# Patient Record
Sex: Female | Born: 1940 | Race: White | Hispanic: No | Marital: Married
Health system: Southern US, Community
[De-identification: ages and names within clinical notes are randomized; demographics above are authoritative.]

## PROBLEM LIST (undated history)

## (undated) DIAGNOSIS — M549 Dorsalgia, unspecified: Secondary | ICD-10-CM

## (undated) DIAGNOSIS — G8929 Other chronic pain: Secondary | ICD-10-CM

## (undated) DIAGNOSIS — F319 Bipolar disorder, unspecified: Secondary | ICD-10-CM

## (undated) DIAGNOSIS — I10 Essential (primary) hypertension: Secondary | ICD-10-CM

## (undated) DIAGNOSIS — E785 Hyperlipidemia, unspecified: Secondary | ICD-10-CM

## (undated) DIAGNOSIS — E119 Type 2 diabetes mellitus without complications: Secondary | ICD-10-CM

## (undated) DIAGNOSIS — F419 Anxiety disorder, unspecified: Secondary | ICD-10-CM

## (undated) DIAGNOSIS — F39 Unspecified mood [affective] disorder: Secondary | ICD-10-CM

## (undated) HISTORY — PX: BACK SURGERY: SHX140

## (undated) HISTORY — PX: TUBAL LIGATION: SHX77

## (undated) HISTORY — PX: TONSILLECTOMY: SUR1361

## (undated) HISTORY — DX: Dorsalgia, unspecified: M54.9

## (undated) HISTORY — DX: Other chronic pain: G89.29

## (undated) HISTORY — DX: Type 2 diabetes mellitus without complications: E11.9

## (undated) HISTORY — DX: Anxiety disorder, unspecified: F41.9

## (undated) HISTORY — DX: Unspecified mood (affective) disorder: F39

## (undated) HISTORY — DX: Essential (primary) hypertension: I10

## (undated) HISTORY — PX: FOOT SURGERY: SHX648

## (undated) HISTORY — DX: Bipolar disorder, unspecified: F31.9

---

## 2008-06-26 ENCOUNTER — Emergency Department (HOSPITAL_COMMUNITY): Admission: EM | Admit: 2008-06-26 | Discharge: 2008-06-26 | Payer: Self-pay | Admitting: Emergency Medicine

## 2011-07-30 ENCOUNTER — Ambulatory Visit (INDEPENDENT_AMBULATORY_CARE_PROVIDER_SITE_OTHER): Payer: Medicare Other | Admitting: Psychiatry

## 2011-07-30 ENCOUNTER — Emergency Department (HOSPITAL_COMMUNITY)
Admission: EM | Admit: 2011-07-30 | Discharge: 2011-08-01 | Disposition: A | Discharge status: Other Acute Inpatient Hospital | Payer: Medicare Other | Attending: Emergency Medicine | Admitting: Emergency Medicine

## 2011-07-30 ENCOUNTER — Encounter (HOSPITAL_COMMUNITY): Payer: Self-pay | Admitting: *Deleted

## 2011-07-30 VITALS — BP 121/76 | HR 75 | Ht 64.0 in | Wt 183.2 lb

## 2011-07-30 DIAGNOSIS — Z79899 Other long term (current) drug therapy: Secondary | ICD-10-CM | POA: Insufficient documentation

## 2011-07-30 DIAGNOSIS — E785 Hyperlipidemia, unspecified: Secondary | ICD-10-CM | POA: Insufficient documentation

## 2011-07-30 DIAGNOSIS — F329 Major depressive disorder, single episode, unspecified: Secondary | ICD-10-CM

## 2011-07-30 DIAGNOSIS — F312 Bipolar disorder, current episode manic severe with psychotic features: Secondary | ICD-10-CM

## 2011-07-30 DIAGNOSIS — F309 Manic episode, unspecified: Secondary | ICD-10-CM | POA: Insufficient documentation

## 2011-07-30 DIAGNOSIS — I10 Essential (primary) hypertension: Secondary | ICD-10-CM | POA: Insufficient documentation

## 2011-07-30 DIAGNOSIS — R4182 Altered mental status, unspecified: Secondary | ICD-10-CM | POA: Insufficient documentation

## 2011-07-30 DIAGNOSIS — F319 Bipolar disorder, unspecified: Secondary | ICD-10-CM

## 2011-07-30 HISTORY — DX: Hyperlipidemia, unspecified: E78.5

## 2011-07-30 MED ORDER — RISPERIDONE 0.25 MG PO TABS: ORAL_TABLET | ORAL | Status: DC

## 2011-07-30 NOTE — ED Provider Notes (Signed)
History  This chart was scribed for [employer]. Colon Branch, MD scribed by Magnus Sinning. The patient was seen in room APA12/APA12 seen at 23:27.    CSN: [id number]  Arrival date & time 07/30/11  2203   First MD Initiated Contact with Patient 07/30/11 2304      Chief Complaint  Patient presents with  . Manic Behavior    (Consider location/radiation/quality/duration/timing/severity/associated sxs/prior treatment) HPI Mariah Hoover is a 71 y.o. female who presents to the Emergency Department at her family's request to be evaluated for manic behavior. Patient says her family has been worried because of her behavior, but she explains that" she is happy, feels great, and has been active." She states she is getting approximately 6-7 hours of sleep ,and that she has been very active in cleaning her home. She denies that she's been spending money recently.  Daughter says that the patient has a h/o of depression and was institutionalized 20 years ago in a facility in [place]. Per family, patient has been manic, but states behavior improved 10 months ago. They explain that 3 days ago the patient starting having behavioral outbursts again along with mania. Patient reportedly threatened to kill her daughter 3 days ago. Family also explains that pt has been spending a lot of money recently, eating candy and drinking cola excessively, not sleeping,and hypervigilant. Daughter also says she was prescribed Abilify, which she refuses to take.    Husband advised that increased behavior began about 2 months ago and gotten progressively worse. She has been verbally aggressive with family.She was seen for the first time today by Dr. Donell Beers, outpatient psychiatrist in [place].  PCP Dr. Sherril Croon Dr. Donell Beers: Outpatient psychiatry Past Medical History  Diagnosis Date  . Hypertension   . Hyperlipidemia     No past surgical history on file.  No family history on file.  History  Substance Use Topics  . Smoking  status: Not on file  . Smokeless tobacco: Not on file  . Alcohol Use:    Review of Systems  All other systems reviewed and are negative.   10 Systems reviewed and are negative for acute change except as noted in the HPI. Allergies  Review of patient's allergies indicates not on file.  Home Medications   Current Outpatient Rx  Name Route Sig Dispense Refill  . RISPERIDONE 0.25 MG PO TABS  Take 1 tablet at bedtime for one week,then take  2 tablets at bedtime 60 tablet 3    After first prescription finished:Take two tablets ...    BP 147/57  Pulse 74  Temp(Src) 97.8 F (36.6 C) (Oral)  Resp 20  Ht 5\' 4"  (1.626 m)  Wt 181 lb (82.101 kg)  BMI 31.07 kg/m2  SpO2 100%  Physical Exam  Nursing note and vitals reviewed. Constitutional: She is oriented to person, place, and time. She appears well-developed and well-nourished. No distress.  HENT:  Head: Normocephalic and atraumatic.  Eyes: EOM are normal. Pupils are equal, round, and reactive to light.  Neck: Neck supple. No tracheal deviation present.  Cardiovascular: Normal rate.   Pulmonary/Chest: Effort normal. No respiratory distress.  Abdominal: Soft. She exhibits no distension.  Musculoskeletal: Normal range of motion. She exhibits no edema.  Neurological: She is alert and oriented to person, place, and time. No sensory deficit.  Skin: Skin is warm and dry.  Psychiatric: She has a normal mood and affect. Her behavior is normal.       Manic   ED Course  Procedures (  including critical care time) Results for orders placed during the hospital encounter of 07/30/11  CBC      Component Value Range   WBC 6.5  4.0 - 10.5 (K/uL)   RBC 3.69 (*) 3.87 - 5.11 (MIL/uL)   Hemoglobin 11.8 (*) 12.0 - 15.0 (g/dL)   HCT 16.1 (*) 09.6 - 46.0 (%)   MCV 92.4  78.0 - 100.0 (fL)   MCH 32.0  26.0 - 34.0 (pg)   MCHC 34.6  30.0 - 36.0 (g/dL)   RDW 04.5  40.9 - 81.1 (%)   Platelets 345  150 - 400 (K/uL)  DIFFERENTIAL      Component Value  Range   Neutrophils Relative 47  43 - 77 (%)   Neutro Abs 3.1  1.7 - 7.7 (K/uL)   Lymphocytes Relative 40  12 - 46 (%)   Lymphs Abs 2.6  0.7 - 4.0 (K/uL)   Monocytes Relative 9  3 - 12 (%)   Monocytes Absolute 0.6  0.1 - 1.0 (K/uL)   Eosinophils Relative 4  0 - 5 (%)   Eosinophils Absolute 0.3  0.0 - 0.7 (K/uL)   Basophils Relative 0  0 - 1 (%)   Basophils Absolute 0.0  0.0 - 0.1 (K/uL)  BASIC METABOLIC PANEL      Component Value Range   Sodium 124 (*) 135 - 145 (mEq/L)   Potassium 4.3  3.5 - 5.1 (mEq/L)   Chloride 89 (*) 96 - 112 (mEq/L)   CO2 23  19 - 32 (mEq/L)   Glucose, Bld 134 (*) 70 - 99 (mg/dL)   BUN 19  6 - 23 (mg/dL)   Creatinine, Ser 9.14  0.50 - 1.10 (mg/dL)   Calcium 9.9  8.4 - 78.2 (mg/dL)   GFR calc non Af Amer 68 (*) >90 (mL/min)   GFR calc Af Amer 79 (*) >90 (mL/min)  URINALYSIS, ROUTINE W REFLEX MICROSCOPIC      Component Value Range   Color, Urine STRAW (*) YELLOW    APPearance CLEAR  CLEAR    Specific Gravity, Urine <1.005 (*) 1.005 - 1.030    pH 6.0  5.0 - 8.0    Glucose, UA NEGATIVE  NEGATIVE (mg/dL)   Hgb urine dipstick NEGATIVE  NEGATIVE    Bilirubin Urine NEGATIVE  NEGATIVE    Ketones, ur NEGATIVE  NEGATIVE (mg/dL)   Protein, ur NEGATIVE  NEGATIVE (mg/dL)   Urobilinogen, UA 0.2  0.0 - 1.0 (mg/dL)   Nitrite NEGATIVE  NEGATIVE    Leukocytes, UA SMALL (*) NEGATIVE   URINE RAPID DRUG SCREEN (HOSP PERFORMED)      Component Value Range   Opiates NONE DETECTED  NONE DETECTED    Cocaine NONE DETECTED  NONE DETECTED    Benzodiazepines NONE DETECTED  NONE DETECTED    Amphetamines NONE DETECTED  NONE DETECTED    Tetrahydrocannabinol NONE DETECTED  NONE DETECTED    Barbiturates NONE DETECTED  NONE DETECTED   ETHANOL      Component Value Range   Alcohol, Ethyl (B) <11  0 - 11 (mg/dL)  URINE MICROSCOPIC-ADD ON      Component Value Range   Squamous Epithelial / LPF FEW (*) RARE    WBC, UA 11-20  <3 (WBC/hpf)   Bacteria, UA RARE  RARE   Dg Chest 2  View  07/31/2011  *RADIOLOGY REPORT*  Clinical Data: Weakness.  Hypoglycemia.  CHEST - 2 VIEW  Comparison: None.  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  Mild atherosclerotic calcification of the aortic arch noted.  There is minimal lingular scarring.  Thoracic spondylosis is present.  No pleural effusion is observed.  Heart size is within normal limits.  IMPRESSION:  1.  Emphysema. 2.  Minimal lingular scarring. 3.  Thoracic spondylosis. 4.  Aortic arch atherosclerotic calcification.  Original Report Authenticated By: Dellia Cloud, M.D.   Ct Head Wo Contrast  07/31/2011  *RADIOLOGY REPORT*  Clinical Data: Altered level of consciousness.  CT HEAD WITHOUT CONTRAST  Technique:  Contiguous axial images were obtained from the base of the skull through the vertex without contrast.  Comparison: None.  Findings: The brain stem, cerebellum, cerebral peduncles, thalami, basal ganglia, basilar cisterns, and ventricular system appear unremarkable.  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  No intracranial hemorrhage, mass lesion, or acute infarction is identified.  IMPRESSION:  1. 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 2.  No acute intracranial findings are observed.  Original Report Authenticated By: Dellia Cloud, M.D.   DIAGNOSTIC STUDIES: Oxygen Saturation is 100% on room air, normal by my interpretation.     Date: 07/31/2011  0027  Rate:70  Rhythm: normal sinus rhythm  QRS Axis: normal  Intervals: normal  ST/T Wave abnormalities: normal  Conduction Disutrbances: none  Narrative Interpretation: unremarkable    COORDINATION OF CARE:\ 2330 Spoke with family members, 2 daughters and patient's sister. IVC paperwork completed and on the chart.   2335 Spoke with Dr. Donell Beers, psychiatrist. He had met the patient today. Agrees she is manic. Agrees she need in patient treatment. Recommended using both abilify and respirdal. 0108  Spoke with Santina Evans, ACT. Advised patient was medically cleared for evaluation and placement.  1610 Patient did not want to remain in room 12. Sitting in chair in hallway 8.  0250 Patient has taken PO medicines. Moved to room 14. Sitter at the bedside.    MDM  Patient with remote history of depression here with a manic episode. Patient with pressured speech, tangential thinking, hypersexuality, not sleeping well, eating poorly, hyperacute senses, medication noncompliance, verbally aggressive with family. Laboratory data is unremarkable. EKG is normal. Chest x-ray with no acute findings. CT of the head without acute findings.IVC paperwork and psych holding orders placed on the chart.   I personally performed the services described in this documentation, which was scribed in my presence. The recorded information has been reviewed and considered.  MDM Reviewed: nursing note and vitals Interpretation: labs, ECG, x-ray and CT scan          Nicoletta Dress. Colon Branch, MD 07/31/11 [phone]

## 2011-07-30 NOTE — ED Notes (Addendum)
Pt brought to department by family who wishes to have pt evaluated for bipolar disorder.  Pt was seen by her psychiatrist Dr. Donell Beers today.  Per family, pt is not taking medications and is in manic. Pt appears calm at present time.  No aggressive or assertive behavior noted at present. Pt denies any SI/HI.  Pt alert and cooperative.

## 2011-07-30 NOTE — Progress Notes (Signed)
Psychiatric Assessment Adult  Patient Identification:  Mariah Hoover Date of Evaluation:  07/30/2011 Chief Complaint: Nothing is wrong with me History of Chief Complaint:  No chief complaint on file.   HPI this patient is a 71 year old white married mother who was brought to office under the urgency of her daughter [name] who is here for the evaluation. This patient had an episode of major depression over 20 years ago in response to the death of a loved one. Since that time she has not had any psychiatric care and has had no significant psychiatric symptoms. Over the last month however her husband has been quite sick and is having cardiac surgery in the next week. Over the last month the patient has shown some dramatic changes in her character/personality. The patient according to the daughter has become hyperverbal and super energized. She is a decreased need for sleep, has gone on spending sprees and at times has been sexually inappropriate to the public. Her daughter claims that she has cussed and has been physically aggressive to family members. This evidently is completely out of character for this patient. She herself acknowledges that she feels high. Her daughter claims that time she is speaking illogical and tangential.Her daughter says she's been extremely irritable and aggressive towards her. According to the daughter and the patient there are no psychotic symptoms present. She does not drink alcohol and has never had past symptoms of mania. She has no specific symptoms of generalized anxiety disorder, panic disorder or obsessive-compulsive disorder. Other than her ill husband she is very few other stresses. She's had no recent deaths, finances are good and her health is good. She recently was seen by her primary care doctor who made this referral and started her on 5 mg of Abilify. The patient is very guarded and apparently very allergic to a number of different substances. Therefore she did not  take this medication and wanted to wait until this interview. This patient is not suicidal and I do not believe his dangerous to herself or anybody else. Review of Systems Physical Exam  Depressive Symptoms: psychomotor agitation,  (Hypo) Manic Symptoms:   Elevated Mood:  Yes Irritable Mood:  Yes Grandiosity:  No Distractibility:  No Labiality of Mood:  Yes Delusions:  No Hallucinations:  No Impulsivity:  No Sexually Inappropriate Behavior:  Yes Financial Extravagance:  Yes Flight of Ideas:  No  Anxiety Symptoms: Excessive Worry:  No Panic Symptoms:  No Agoraphobia:  No Obsessive Compulsive: No  Symptoms: None, Specific Phobias:  No Social Anxiety:  No  Psychotic Symptoms:  Hallucinations: No None Delusions:  No Paranoia:  No   Ideas of Reference:  No  PTSD Symptoms: Ever had a traumatic exposure:   Had a traumatic exposure in the last month:   Re-experiencing: No None Hypervigilance:  Yes Hyperarousal: Yes  Avoidance: No None  Traumatic Brain Injury: No   Past Psychiatric History: Diagnosis: Major depression  Hospitalizations: one time over 20 years ago  Outpatient Care: none  Substance Abuse Care: none  Self-Mutilation: none  Suicidal Attempts: none  Violent Behaviors: yes   Past Medical History:  No past medical history on file. History of Loss of Consciousness:  No Seizure History:  No Cardiac History:  No Allergies:  Allergies not on file Current Medications:  No current outpatient prescriptions on file.    Previous Psychotropic Medications:  Medication Dose  Substance Abuse History in the last 12 months: Substance Age of 1st Use Last Use Amount Specific Type                                                                                              Medical Consequences of Substance Abuse:    Legal Consequences of Substance Abuse:    Family Consequences of Substance Abuse:      Social  History: Current Place of Residence: [place], [place] Place of Birth:  Family Members: Marital Status:  Married Children: 2  Sons: 1  Daughters: 1 Relationships: Education:  HS Print production planner Problems/Performance:  Religious Beliefs/Practices:  History of Abuse: none Teacher, music History:  None. Legal History:  Hobbies/Interests:   Family History:  No family history on file.  Mental Status Examination/Evaluation: Objective:  Appearance: Casual  Eye Contact::  Good  Speech:  Normal Rate  Volume:  Normal  Mood:  Cheerful  Affect:  Full Range  Thought Process:  Coherent  Orientation:  Full  Thought Content:  WDL  Suicidal Thoughts:  No  Homicidal Thoughts:  No  Judgement:  Fair  Insight:  Lacking  Psychomotor Activity:  Increased  Akathisia:  No  Handed:  Right  AIMS (if indicated):    Assets:  Social Support    Laboratory/X-Ray Psychological Evaluation(s)        Assessment:  Axis I: Bipolar, Manic  AXIS I Bipolar, Manic  AXIS II Deferred  AXIS III No past medical history on file.   AXIS IV other psychosocial or environmental problems  AXIS V 41-50 serious symptoms   Treatment Plan/Recommendations:  Plan of Care: Based upon this evaluation it is very evident that this patient is experiencing mania. This is somewhat unusual to have an elderly onset but still possible her symptoms are dramatic with inappropriate euphoria given that her husband is going to have heart surgery. Her emotional response is clearly inconsistent with what one would expect. Her energy level is hyper and she's been reported to clean things up all around her house and needs a reduced amount of sleep to do it. In my interview she spoke somewhat rapidly but according to the daughter has had episodes when she has been irrational with rapid speech. Her family is very distressed and concerned. The patient apparently today and is very argumentative and controlling. I  offered the family Abilify but they claim this is too expensive for them. They're attempting now to try to get Abilify to a special patient assistance program but for the time being they have agreed to have her start on Risperdal 0.25 mg at night for one week and then to increase it to 2 (total of .5)at night. This patient agreed with this plan and to return to see me in 2 weeks.   Laboratory:    Psychotherapy: none  Medications:Risperidol .5  2  qhs  Routine PRN Medications:  No  Consultations:   Safety Concerns:  mild  Other:      Jacoya Bauman, [place], MD 5/15/20134:31 PM

## 2011-07-31 ENCOUNTER — Emergency Department (HOSPITAL_COMMUNITY): Payer: Medicare Other

## 2011-07-31 ENCOUNTER — Encounter (HOSPITAL_COMMUNITY): Payer: Self-pay | Admitting: Psychiatry

## 2011-07-31 DIAGNOSIS — F329 Major depressive disorder, single episode, unspecified: Secondary | ICD-10-CM | POA: Insufficient documentation

## 2011-07-31 DIAGNOSIS — F319 Bipolar disorder, unspecified: Secondary | ICD-10-CM

## 2011-07-31 DIAGNOSIS — F32A Depression, unspecified: Secondary | ICD-10-CM | POA: Insufficient documentation

## 2011-07-31 HISTORY — DX: Bipolar disorder, unspecified: F31.9

## 2011-07-31 LAB — DIFFERENTIAL
Eosinophils Absolute: 0.3 10*3/uL (ref 0.0–0.7)
Lymphs Abs: 2.6 10*3/uL (ref 0.7–4.0)
Monocytes Relative: 9 % (ref 3–12)
Neutro Abs: 3.1 10*3/uL (ref 1.7–7.7)
Neutrophils Relative %: 47 % (ref 43–77)

## 2011-07-31 LAB — RAPID URINE DRUG SCREEN, HOSP PERFORMED
Barbiturates: NOT DETECTED
Benzodiazepines: NOT DETECTED
Cocaine: NOT DETECTED
Tetrahydrocannabinol: NOT DETECTED

## 2011-07-31 LAB — BASIC METABOLIC PANEL
BUN: 19 mg/dL (ref 6–23)
BUN: 15 mg/dL (ref 6–23)
CO2: 27 mEq/L (ref 19–32)
Chloride: 89 mEq/L — ABNORMAL LOW (ref 96–112)
Chloride: 92 mEq/L — ABNORMAL LOW (ref 96–112)
Creatinine, Ser: 0.96 mg/dL (ref 0.50–1.10)
Glucose, Bld: 134 mg/dL — ABNORMAL HIGH (ref 70–99)
Potassium: 4.3 mEq/L (ref 3.5–5.1)

## 2011-07-31 LAB — URINALYSIS, ROUTINE W REFLEX MICROSCOPIC
Bilirubin Urine: NEGATIVE
Hgb urine dipstick: NEGATIVE
Specific Gravity, Urine: <1.005 — ABNORMAL LOW (ref 1.005–1.030)
pH: 6 (ref 5.0–8.0)

## 2011-07-31 LAB — CBC
Hemoglobin: 11.8 g/dL — ABNORMAL LOW (ref 12.0–15.0)
MCH: 32 pg (ref 26.0–34.0)
RBC: 3.69 MIL/uL — ABNORMAL LOW (ref 3.87–5.11)

## 2011-07-31 MED ORDER — ARIPIPRAZOLE 2 MG PO TABS
2.0000 mg | ORAL_TABLET | Freq: Once | ORAL | Status: AC
  Administered 2011-07-31: 2 mg via ORAL
  Filled 2011-07-31: qty 1

## 2011-07-31 MED ORDER — RISPERIDONE 0.5 MG PO TABS
0.2500 mg | ORAL_TABLET | Freq: Every day | ORAL | Status: DC
  Administered 2011-07-31 – 2011-08-01 (×2): 0.25 mg via ORAL
  Filled 2011-07-31 (×4): qty 0.5
  Filled 2011-07-31 (×2): qty 1

## 2011-07-31 MED ORDER — RISPERIDONE 1 MG PO TABS
ORAL_TABLET | ORAL | Status: AC
  Filled 2011-07-31: qty 1

## 2011-07-31 MED ORDER — ACETAMINOPHEN 325 MG PO TABS: 650.0000 mg | ORAL_TABLET | ORAL | Status: DC | PRN

## 2011-07-31 MED ORDER — LORAZEPAM 1 MG PO TABS
1.0000 mg | ORAL_TABLET | Freq: Once | ORAL | Status: AC
  Administered 2011-07-31: 1 mg via ORAL
  Filled 2011-07-31: qty 1

## 2011-07-31 MED ORDER — RISPERIDONE 0.5 MG PO TABS
0.2500 mg | ORAL_TABLET | Freq: Every day | ORAL | Status: DC
  Administered 2011-07-31: 0.25 mg via ORAL
  Administered 2011-07-31: 0.5 mg via ORAL
  Filled 2011-07-31 (×4): qty 0.5
  Filled 2011-07-31: qty 1

## 2011-07-31 MED ORDER — CIPROFLOXACIN HCL 250 MG PO TABS
250.0000 mg | ORAL_TABLET | Freq: Two times a day (BID) | ORAL | Status: DC
  Administered 2011-07-31 – 2011-08-01 (×3): 250 mg via ORAL
  Filled 2011-07-31 (×2): qty 1
  Filled 2011-07-31: qty 2

## 2011-07-31 MED ORDER — ONDANSETRON HCL 4 MG PO TABS: 4.0000 mg | ORAL_TABLET | Freq: Three times a day (TID) | ORAL | Status: DC | PRN

## 2011-07-31 MED ORDER — CIPROFLOXACIN HCL 250 MG PO TABS
250.0000 mg | ORAL_TABLET | Freq: Once | ORAL | Status: AC
  Administered 2011-07-31: 250 mg via ORAL
  Filled 2011-07-31: qty 1

## 2011-07-31 NOTE — ED Notes (Signed)
Report to next shift

## 2011-07-31 NOTE — ED Notes (Signed)
Pam Works (daughter) [phone]

## 2011-07-31 NOTE — ED Notes (Signed)
Patient did not wish to remain in her room, wanting to go into the hallway while she awaits her lab results and whatever the doctor wants done"  Placed in chair at [place] 8 just beside the nurses station.

## 2011-07-31 NOTE — ED Notes (Signed)
Clothing left as when she arrived and not wanded at direction of Dr. Colon Branch.  Initially patient very sensitive to any activities, but did consent to labs, EKG, CT head and CXR.  Provided urine specimen voluntarily.

## 2011-07-31 NOTE — ED Notes (Signed)
Mariah Hoover, from [facility] called and states that they will accept pt when her sodium is wnl.  Notified edp.

## 2011-07-31 NOTE — ED Notes (Signed)
Regular hospital bed placed in room #19 for patient.  Environment modified - lights dimmed.  Patient removed her contact lens and placed in sterile containers with sterile normal saline.  Sitter at bedside.    Patients purse given to her spouse prior to him leaving to go home.  Patient still has on her own clothes - talks about her many allergies to fabrics, etc.  Allowed to leave her green shirt and pants on -- slipper socks provided.

## 2011-07-31 NOTE — ED Notes (Signed)
Called [facility], spoke with Delorise Shiner [phone].  Gave them the new sodium level of 129.  Delorise Shiner reports that this is still too low and they will not accept the pt until it is wnl.  Notified edp

## 2011-07-31 NOTE — BH Assessment (Signed)
Assessment Note   Mariah Hoover is an 71 y.o. female. Patient brought to the ED last night. She is manic, with rapid pressured speech. She is not sleeping, not eating and quick to anger. She has become increasingly aggressive with family members. She is not taking her medications. She was hospitalized 20 years ago, with depressions after the loss of  A close relative. Current stressor appears to be the failing health of her spouse.  Axis I: Bipolar, Manic Axis II: Deferred Axis III:  Past Medical History  Diagnosis Date  . Hypertension   . Hyperlipidemia   . Bipolar I disorder with mania 07/31/2011   Axis IV: other psychosocial or environmental problems Axis V: 41-50 serious symptoms  Past Medical History:  Past Medical History  Diagnosis Date  . Hypertension   . Hyperlipidemia   . Bipolar I disorder with mania 07/31/2011    No past surgical history on file.  Family History: No family history on file.  Social History:  does not have a smoking history on file. She does not have any smokeless tobacco history on file. She reports that she drinks alcohol. Her drug history not on file.  Additional Social History:    Allergies: Not on File  Home Medications:  (Not in a hospital admission)  OB/GYN Status:  No LMP recorded. Patient is postmenopausal.  General Assessment Data Location of Assessment: [facility] ACT Assessment: Yes Living Arrangements: Spouse/significant other Can pt return to current living arrangement?: Yes Admission Status: Involuntary Is patient capable of signing voluntary admission?: No Transfer from: Acute Hospital Referral Source: Self/Family/Friend  Education Status Is patient currently in school?: No  Risk to self Suicidal Ideation: No Suicidal Intent: No Is patient at risk for suicide?: No Suicidal Plan?: No Access to Means: No What has been your use of drugs/alcohol within the last 12 months?: nonr Previous Attempts/Gestures: No Other Self  Harm Risks: none Triggers for Past Attempts: None known Intentional Self Injurious Behavior: None Family Suicide History: No Recent stressful life event(s): Other (Comment) (husband's medical issues) Persecutory voices/beliefs?: No Depression: Yes Depression Symptoms: Insomnia;Feeling angry/irritable Substance abuse history and/or treatment for substance abuse?: No Suicide prevention information given to non-admitted patients: Not applicable  Risk to Others Homicidal Ideation: No Thoughts of Harm to Others: No Current Homicidal Intent: No Current Homicidal Plan: No Access to Homicidal Means: No History of harm to others?: No Assessment of Violence: In past 6-12 months Violent Behavior Description: increased agressiveness toard family;increased agitation Does patient have access to weapons?: No Criminal Charges Pending?: No Does patient have a court date: No  Psychosis Hallucinations: None noted Delusions: None noted  Mental Status Report Appear/Hygiene: Improved Eye Contact: Fair Motor Activity: Agitation;Hyperactivity;Restlessness Speech: Rapid;Pressured;Tangential (illogical) Level of Consciousness: Restless;Irritable Mood: Labile;Irritable (elevated;) Affect: Euphoric;Irritable;Labile Anxiety Level: None Thought Processes: Tangential;Irrelevant Judgement: Impaired Orientation: Person;Place;Time;Situation Obsessive Compulsive Thoughts/Behaviors: None  Cognitive Functioning Concentration: Decreased Memory: Recent Impaired;Remote Impaired IQ: Average Insight: Poor Impulse Control: Poor Appetite: Poor Sleep: Decreased Vegetative Symptoms: None  Prior Inpatient Therapy Prior Inpatient Therapy: Yes Prior Therapy Dates: 1993 Prior Therapy Facilty/Provider(s): [place] Reason for Treatment: depression  Prior Outpatient Therapy Prior Outpatient Therapy: No            Values / Beliefs Cultural Requests During Hospitalization: None Spiritual Requests During  Hospitalization: None     Nutrition Screen Diet: Regular  Additional Information 1:1 In Past 12 Months?: No CIRT Risk: No Elopement Risk: No Does patient have medical clearance?: Yes  Disposition: Patient referred to [facility] for consideration. Dr. Ignacia Palma is in agreement with this plan. Disposition Disposition of Patient: Inpatient treatment program;Referred to [facility] Type of inpatient treatment program: Adult Patient referred to: Other (Comment) [facility])  On Site Evaluation by:   Reviewed with Physician:     Jake Shark [facility] 07/31/2011 10:21 AM

## 2011-07-31 NOTE — Progress Notes (Signed)
1:56 PM Pt tentatively accepted at [facility], but they noted that her sodium was 124 on initial labs.  Will repeat her BMET.

## 2011-07-31 NOTE — ED Notes (Signed)
Pt resting calmly w/ eyes closed. Rise & fall of the chest noted. Sitter at bedside. Bed in low position, side rails up x2. NAD noted at this time.  

## 2011-07-31 NOTE — ED Notes (Signed)
Sitting quietly in room 14 - speaking with sitter.  Cooperative.

## 2011-07-31 NOTE — ED Notes (Signed)
Pt cheerful & laughing, states she is going to do whatever needs to be done to get over this. Sitter in room w/ pt. No needs voiced at this time.

## 2011-07-31 NOTE — ED Notes (Signed)
Advised in report, Dr Adriana Simas to call & speak w/ [place] on pt NA level.

## 2011-08-01 LAB — BASIC METABOLIC PANEL
Chloride: 98 mEq/L (ref 96–112)
GFR calc Af Amer: 84 mL/min — ABNORMAL LOW (ref >90)
Potassium: 4.4 mEq/L (ref 3.5–5.1)

## 2011-08-01 NOTE — ED Notes (Signed)
Patient had Blood drawn at this time. Also vitals were taken. Let patient know that she had fresh change of clothing if she wanted to take a shower. She stated that she wanted to wait til later when her family comes. Let her nurse know Banker) Ron.

## 2011-08-01 NOTE — ED Notes (Signed)
Breakfast given to pt. Sitter at bsd. nad noted. Calm/cooperative at this time.

## 2011-08-01 NOTE — ED Notes (Signed)
Pt has bed at [facility] RM [id number] Message left for Pts spouse on answering machine will Inform pt

## 2011-08-01 NOTE — BH Assessment (Signed)
Assessment Note   Mariah Hoover is an 71 y.o. female. Patient continues to present manic with pressured speech and agitated. Patient has been accepted to [facility] per April accepted by Dr. Lowanda Foster to Room 424 B.  Axis I: Bipolar, Manic Axis II: Deferred Axis III:  Past Medical History  Diagnosis Date  . Hypertension   . Hyperlipidemia   . Bipolar I disorder with mania 07/31/2011   Axis IV: other psychosocial or environmental problems, problems related to social environment and problems with primary support group Axis V: 21-30 behavior considerably influenced by delusions or hallucinations OR serious impairment in judgment, communication OR inability to function in almost all areas  Past Medical History:  Past Medical History  Diagnosis Date  . Hypertension   . Hyperlipidemia   . Bipolar I disorder with mania 07/31/2011    No past surgical history on file.  Family History: No family history on file.  Social History:  does not have a smoking history on file. She does not have any smokeless tobacco history on file. She reports that she drinks alcohol. Her drug history not on file.  Additional Social History:    Allergies:  Allergies  Allergen Reactions  . Penicillins Anaphylaxis  . Codeine Itching and Rash  . Latex Itching and Rash    Home Medications:  (Not in a hospital admission)  OB/GYN Status:  No LMP recorded. Patient is postmenopausal.  General Assessment Data Location of Assessment: [facility] ACT Assessment: Yes Living Arrangements: Spouse/significant other Can pt return to current living arrangement?: Yes Admission Status: Involuntary Is patient capable of signing voluntary admission?: No Transfer from: Acute Hospital Referral Source: Self/Family/Friend  Education Status Is patient currently in school?: No  Risk to self Suicidal Ideation: No Suicidal Intent: No Is patient at risk for suicide?: No Suicidal Plan?: No Access to  Means: No What has been your use of drugs/alcohol within the last 12 months?: nonr Previous Attempts/Gestures: No Other Self Harm Risks: none Triggers for Past Attempts: None known Intentional Self Injurious Behavior: None Family Suicide History: No Recent stressful life event(s): Other (Comment) (husband's medical issues) Persecutory voices/beliefs?: No Depression: Yes Depression Symptoms: Insomnia;Feeling angry/irritable Substance abuse history and/or treatment for substance abuse?: No Suicide prevention information given to non-admitted patients: Not applicable  Risk to Others Homicidal Ideation: No Thoughts of Harm to Others: No Current Homicidal Intent: No Current Homicidal Plan: No Access to Homicidal Means: No History of harm to others?: No Assessment of Violence: In past 6-12 months Violent Behavior Description: increased agressiveness toard family;increased agitation Does patient have access to weapons?: No Criminal Charges Pending?: No Does patient have a court date: No  Psychosis Hallucinations: None noted Delusions: None noted  Mental Status Report Appear/Hygiene: Improved Eye Contact: Fair Motor Activity: Agitation;Hyperactivity;Restlessness Speech: Rapid;Pressured;Tangential (illogical) Level of Consciousness: Restless;Irritable Mood: Labile;Irritable (elevated;) Affect: Euphoric;Irritable;Labile Anxiety Level: None Thought Processes: Tangential;Irrelevant Judgement: Impaired Orientation: Person;Place;Time;Situation Obsessive Compulsive Thoughts/Behaviors: None  Cognitive Functioning Concentration: Decreased Memory: Recent Impaired;Remote Impaired IQ: Average Insight: Poor Impulse Control: Poor Appetite: Poor Sleep: Decreased Vegetative Symptoms: None  Prior Inpatient Therapy Prior Inpatient Therapy: Yes Prior Therapy Dates: 1993 Prior Therapy Facilty/Provider(s): [place] Reason for Treatment: depression  Prior Outpatient Therapy Prior  Outpatient Therapy: No            Values / Beliefs Cultural Requests During Hospitalization: None Spiritual Requests During Hospitalization: None     Nutrition Screen Diet: Regular  Additional Information 1:1 In Past 12 Months?: No CIRT Risk:  No Elopement Risk: No Does patient have medical clearance?: Yes     Disposition:  Disposition Disposition of Patient: Referred to ([facility]) Type of inpatient treatment program: Adult Patient referred to: Other (Comment) (accepted [facility])  On Site Evaluation by:   Reviewed with Physician:     Rudi Coco 08/01/2011 3:18 PM

## 2011-08-01 NOTE — ED Notes (Signed)
Received report Agree with previous assessment; Pt calm at this time; Pt is being moved to RM 15 for sitter purposes

## 2011-08-01 NOTE — ED Notes (Signed)
Pt brought back to ED by Police due to missing paperwork per Intake officer. Awaiting Sheriff's department to bring appropriate paperwork for transport. Pt awake and alert. Denies suicidal or homicidal ideation.

## 2011-08-01 NOTE — ED Notes (Signed)
[employer] back  To get IVC paperwork

## 2011-08-01 NOTE — ED Provider Notes (Addendum)
Patient's paperwork resubmitted to [facility] now that sodium is corrected she no longer has a bed saved for her will have to be re\re considered. Act team is coming in at 1:00 to assess other site patients we will have them take a look at her. May also consider to tell us to psych evaluation since patient does not currently seen manic at all must of been that way upon arrival, patient may be able to be discharged home. Will go ahead and initiate tell psych consultation.  Shelda Jakes, MD 08/01/11 1048   Addendum:  Additional information obtained from the husband patient has a past history of bipolar disorder and has been eliciting manic behavior for several days at home most likely patient definitely does require the admission to [facility].  Shelda Jakes, MD 08/01/11 1210

## 2011-08-01 NOTE — ED Notes (Signed)
Papers delivered by [facility] Department. [place] [employer] called to come and transport patient.

## 2011-08-01 NOTE — Progress Notes (Signed)
4098 Patient has slept through the night. She was reviewed by [facility]. They will not take her until sodium is >130. Fluid restriction overnight. Will recheck labs later this morning. Resubmit to [facility].

## 2011-08-01 NOTE — ED Notes (Signed)
Lab work faxed to [facility]

## 2011-08-13 ENCOUNTER — Ambulatory Visit (HOSPITAL_COMMUNITY): Payer: Self-pay | Admitting: Psychiatry

## 2015-04-20 DIAGNOSIS — F258 Other schizoaffective disorders: Secondary | ICD-10-CM | POA: Diagnosis not present

## 2015-04-20 DIAGNOSIS — E1165 Type 2 diabetes mellitus with hyperglycemia: Secondary | ICD-10-CM | POA: Diagnosis not present

## 2015-04-20 DIAGNOSIS — G4700 Insomnia, unspecified: Secondary | ICD-10-CM | POA: Diagnosis not present

## 2015-05-21 DIAGNOSIS — H2511 Age-related nuclear cataract, right eye: Secondary | ICD-10-CM | POA: Diagnosis not present

## 2015-05-21 DIAGNOSIS — H25012 Cortical age-related cataract, left eye: Secondary | ICD-10-CM | POA: Diagnosis not present

## 2015-05-21 DIAGNOSIS — H40013 Open angle with borderline findings, low risk, bilateral: Secondary | ICD-10-CM | POA: Diagnosis not present

## 2015-05-21 DIAGNOSIS — H25011 Cortical age-related cataract, right eye: Secondary | ICD-10-CM | POA: Diagnosis not present

## 2015-05-21 DIAGNOSIS — H2512 Age-related nuclear cataract, left eye: Secondary | ICD-10-CM | POA: Diagnosis not present

## 2015-05-22 DIAGNOSIS — Z789 Other specified health status: Secondary | ICD-10-CM | POA: Diagnosis not present

## 2015-05-22 DIAGNOSIS — Z299 Encounter for prophylactic measures, unspecified: Secondary | ICD-10-CM | POA: Diagnosis not present

## 2015-05-22 DIAGNOSIS — L659 Nonscarring hair loss, unspecified: Secondary | ICD-10-CM | POA: Diagnosis not present

## 2015-07-13 DIAGNOSIS — Z1389 Encounter for screening for other disorder: Secondary | ICD-10-CM | POA: Diagnosis not present

## 2015-07-13 DIAGNOSIS — E7800 Pure hypercholesterolemia, unspecified: Secondary | ICD-10-CM | POA: Diagnosis not present

## 2015-07-13 DIAGNOSIS — Z0000 Encounter for general adult medical examination without abnormal findings: Secondary | ICD-10-CM | POA: Diagnosis not present

## 2015-07-13 DIAGNOSIS — Z299 Encounter for prophylactic measures, unspecified: Secondary | ICD-10-CM | POA: Diagnosis not present

## 2015-07-13 DIAGNOSIS — Z79899 Other long term (current) drug therapy: Secondary | ICD-10-CM | POA: Diagnosis not present

## 2015-07-13 DIAGNOSIS — F411 Generalized anxiety disorder: Secondary | ICD-10-CM | POA: Diagnosis not present

## 2015-07-13 DIAGNOSIS — Z1211 Encounter for screening for malignant neoplasm of colon: Secondary | ICD-10-CM | POA: Diagnosis not present

## 2015-07-13 DIAGNOSIS — Z7189 Other specified counseling: Secondary | ICD-10-CM | POA: Diagnosis not present

## 2015-08-09 DIAGNOSIS — I10 Essential (primary) hypertension: Secondary | ICD-10-CM | POA: Diagnosis not present

## 2015-08-09 DIAGNOSIS — E7800 Pure hypercholesterolemia, unspecified: Secondary | ICD-10-CM | POA: Diagnosis not present

## 2015-08-09 DIAGNOSIS — F329 Major depressive disorder, single episode, unspecified: Secondary | ICD-10-CM | POA: Diagnosis not present

## 2015-08-09 DIAGNOSIS — E119 Type 2 diabetes mellitus without complications: Secondary | ICD-10-CM | POA: Diagnosis not present

## 2015-08-27 DIAGNOSIS — H40013 Open angle with borderline findings, low risk, bilateral: Secondary | ICD-10-CM | POA: Diagnosis not present

## 2015-10-25 DIAGNOSIS — Z1231 Encounter for screening mammogram for malignant neoplasm of breast: Secondary | ICD-10-CM | POA: Diagnosis not present

## 2015-10-30 DIAGNOSIS — E119 Type 2 diabetes mellitus without complications: Secondary | ICD-10-CM | POA: Diagnosis not present

## 2015-10-30 DIAGNOSIS — G4700 Insomnia, unspecified: Secondary | ICD-10-CM | POA: Diagnosis not present

## 2015-10-30 DIAGNOSIS — E7800 Pure hypercholesterolemia, unspecified: Secondary | ICD-10-CM | POA: Diagnosis not present

## 2015-10-30 DIAGNOSIS — F319 Bipolar disorder, unspecified: Secondary | ICD-10-CM | POA: Diagnosis not present

## 2016-01-16 DIAGNOSIS — Z23 Encounter for immunization: Secondary | ICD-10-CM | POA: Diagnosis not present

## 2016-07-22 DIAGNOSIS — J45909 Unspecified asthma, uncomplicated: Secondary | ICD-10-CM | POA: Diagnosis not present

## 2016-07-22 DIAGNOSIS — F411 Generalized anxiety disorder: Secondary | ICD-10-CM | POA: Diagnosis not present

## 2016-07-22 DIAGNOSIS — Z7189 Other specified counseling: Secondary | ICD-10-CM | POA: Diagnosis not present

## 2016-07-22 DIAGNOSIS — Z1389 Encounter for screening for other disorder: Secondary | ICD-10-CM | POA: Diagnosis not present

## 2016-07-22 DIAGNOSIS — F258 Other schizoaffective disorders: Secondary | ICD-10-CM | POA: Diagnosis not present

## 2016-07-22 DIAGNOSIS — R5383 Other fatigue: Secondary | ICD-10-CM | POA: Diagnosis not present

## 2016-07-22 DIAGNOSIS — R739 Hyperglycemia, unspecified: Secondary | ICD-10-CM | POA: Diagnosis not present

## 2016-07-22 DIAGNOSIS — E7800 Pure hypercholesterolemia, unspecified: Secondary | ICD-10-CM | POA: Diagnosis not present

## 2016-07-22 DIAGNOSIS — Z1211 Encounter for screening for malignant neoplasm of colon: Secondary | ICD-10-CM | POA: Diagnosis not present

## 2016-07-22 DIAGNOSIS — Z79899 Other long term (current) drug therapy: Secondary | ICD-10-CM | POA: Diagnosis not present

## 2016-07-22 DIAGNOSIS — Z6835 Body mass index (BMI) 35.0-35.9, adult: Secondary | ICD-10-CM | POA: Diagnosis not present

## 2016-07-22 DIAGNOSIS — Z0000 Encounter for general adult medical examination without abnormal findings: Secondary | ICD-10-CM | POA: Diagnosis not present

## 2016-07-22 DIAGNOSIS — Z299 Encounter for prophylactic measures, unspecified: Secondary | ICD-10-CM | POA: Diagnosis not present

## 2016-07-22 DIAGNOSIS — I10 Essential (primary) hypertension: Secondary | ICD-10-CM | POA: Diagnosis not present

## 2016-07-30 DIAGNOSIS — Z299 Encounter for prophylactic measures, unspecified: Secondary | ICD-10-CM | POA: Diagnosis not present

## 2016-07-30 DIAGNOSIS — J45909 Unspecified asthma, uncomplicated: Secondary | ICD-10-CM | POA: Diagnosis not present

## 2016-07-30 DIAGNOSIS — E785 Hyperlipidemia, unspecified: Secondary | ICD-10-CM | POA: Diagnosis not present

## 2016-07-30 DIAGNOSIS — Z6835 Body mass index (BMI) 35.0-35.9, adult: Secondary | ICD-10-CM | POA: Diagnosis not present

## 2016-07-30 DIAGNOSIS — F258 Other schizoaffective disorders: Secondary | ICD-10-CM | POA: Diagnosis not present

## 2016-07-30 DIAGNOSIS — I10 Essential (primary) hypertension: Secondary | ICD-10-CM | POA: Diagnosis not present

## 2016-07-30 DIAGNOSIS — F411 Generalized anxiety disorder: Secondary | ICD-10-CM | POA: Diagnosis not present

## 2016-07-30 DIAGNOSIS — F329 Major depressive disorder, single episode, unspecified: Secondary | ICD-10-CM | POA: Diagnosis not present

## 2016-07-30 DIAGNOSIS — E7800 Pure hypercholesterolemia, unspecified: Secondary | ICD-10-CM | POA: Diagnosis not present

## 2016-07-30 DIAGNOSIS — E119 Type 2 diabetes mellitus without complications: Secondary | ICD-10-CM | POA: Diagnosis not present

## 2016-09-10 DIAGNOSIS — E785 Hyperlipidemia, unspecified: Secondary | ICD-10-CM | POA: Diagnosis not present

## 2016-09-30 DIAGNOSIS — R05 Cough: Secondary | ICD-10-CM | POA: Diagnosis not present

## 2016-09-30 DIAGNOSIS — E7800 Pure hypercholesterolemia, unspecified: Secondary | ICD-10-CM | POA: Diagnosis not present

## 2016-09-30 DIAGNOSIS — J40 Bronchitis, not specified as acute or chronic: Secondary | ICD-10-CM | POA: Diagnosis not present

## 2016-09-30 DIAGNOSIS — E669 Obesity, unspecified: Secondary | ICD-10-CM | POA: Diagnosis not present

## 2016-09-30 DIAGNOSIS — Z299 Encounter for prophylactic measures, unspecified: Secondary | ICD-10-CM | POA: Diagnosis not present

## 2016-09-30 DIAGNOSIS — Z789 Other specified health status: Secondary | ICD-10-CM | POA: Diagnosis not present

## 2016-09-30 DIAGNOSIS — E119 Type 2 diabetes mellitus without complications: Secondary | ICD-10-CM | POA: Diagnosis not present

## 2016-09-30 DIAGNOSIS — R918 Other nonspecific abnormal finding of lung field: Secondary | ICD-10-CM | POA: Diagnosis not present

## 2016-09-30 DIAGNOSIS — R0989 Other specified symptoms and signs involving the circulatory and respiratory systems: Secondary | ICD-10-CM | POA: Diagnosis not present

## 2016-09-30 DIAGNOSIS — I700 Atherosclerosis of aorta: Secondary | ICD-10-CM | POA: Diagnosis not present

## 2016-09-30 DIAGNOSIS — F319 Bipolar disorder, unspecified: Secondary | ICD-10-CM | POA: Diagnosis not present

## 2016-10-07 DIAGNOSIS — E669 Obesity, unspecified: Secondary | ICD-10-CM | POA: Diagnosis not present

## 2016-10-07 DIAGNOSIS — Z299 Encounter for prophylactic measures, unspecified: Secondary | ICD-10-CM | POA: Diagnosis not present

## 2016-10-07 DIAGNOSIS — E119 Type 2 diabetes mellitus without complications: Secondary | ICD-10-CM | POA: Diagnosis not present

## 2016-10-07 DIAGNOSIS — F258 Other schizoaffective disorders: Secondary | ICD-10-CM | POA: Diagnosis not present

## 2016-10-07 DIAGNOSIS — J40 Bronchitis, not specified as acute or chronic: Secondary | ICD-10-CM | POA: Diagnosis not present

## 2016-10-07 DIAGNOSIS — Z789 Other specified health status: Secondary | ICD-10-CM | POA: Diagnosis not present

## 2016-10-07 DIAGNOSIS — F319 Bipolar disorder, unspecified: Secondary | ICD-10-CM | POA: Diagnosis not present

## 2016-10-22 DIAGNOSIS — R0602 Shortness of breath: Secondary | ICD-10-CM | POA: Diagnosis not present

## 2016-10-22 DIAGNOSIS — R05 Cough: Secondary | ICD-10-CM | POA: Diagnosis not present

## 2016-10-22 DIAGNOSIS — J209 Acute bronchitis, unspecified: Secondary | ICD-10-CM | POA: Diagnosis not present

## 2016-10-28 DIAGNOSIS — Z1231 Encounter for screening mammogram for malignant neoplasm of breast: Secondary | ICD-10-CM | POA: Diagnosis not present

## 2016-12-18 DIAGNOSIS — F319 Bipolar disorder, unspecified: Secondary | ICD-10-CM | POA: Diagnosis not present

## 2016-12-18 DIAGNOSIS — I10 Essential (primary) hypertension: Secondary | ICD-10-CM | POA: Diagnosis not present

## 2016-12-18 DIAGNOSIS — F39 Unspecified mood [affective] disorder: Secondary | ICD-10-CM | POA: Diagnosis not present

## 2016-12-18 DIAGNOSIS — E7800 Pure hypercholesterolemia, unspecified: Secondary | ICD-10-CM | POA: Diagnosis not present

## 2016-12-18 DIAGNOSIS — J45909 Unspecified asthma, uncomplicated: Secondary | ICD-10-CM | POA: Diagnosis not present

## 2016-12-18 DIAGNOSIS — F411 Generalized anxiety disorder: Secondary | ICD-10-CM | POA: Diagnosis not present

## 2016-12-18 DIAGNOSIS — E1165 Type 2 diabetes mellitus with hyperglycemia: Secondary | ICD-10-CM | POA: Diagnosis not present

## 2016-12-18 DIAGNOSIS — Z299 Encounter for prophylactic measures, unspecified: Secondary | ICD-10-CM | POA: Diagnosis not present

## 2016-12-18 DIAGNOSIS — Z6835 Body mass index (BMI) 35.0-35.9, adult: Secondary | ICD-10-CM | POA: Diagnosis not present

## 2016-12-18 DIAGNOSIS — F258 Other schizoaffective disorders: Secondary | ICD-10-CM | POA: Diagnosis not present

## 2017-01-28 DIAGNOSIS — Z789 Other specified health status: Secondary | ICD-10-CM | POA: Diagnosis not present

## 2017-01-28 DIAGNOSIS — E7800 Pure hypercholesterolemia, unspecified: Secondary | ICD-10-CM | POA: Diagnosis not present

## 2017-01-28 DIAGNOSIS — Z6834 Body mass index (BMI) 34.0-34.9, adult: Secondary | ICD-10-CM | POA: Diagnosis not present

## 2017-01-28 DIAGNOSIS — L820 Inflamed seborrheic keratosis: Secondary | ICD-10-CM | POA: Diagnosis not present

## 2017-01-28 DIAGNOSIS — F411 Generalized anxiety disorder: Secondary | ICD-10-CM | POA: Diagnosis not present

## 2017-01-28 DIAGNOSIS — J069 Acute upper respiratory infection, unspecified: Secondary | ICD-10-CM | POA: Diagnosis not present

## 2017-01-28 DIAGNOSIS — Z299 Encounter for prophylactic measures, unspecified: Secondary | ICD-10-CM | POA: Diagnosis not present

## 2017-05-25 DIAGNOSIS — R05 Cough: Secondary | ICD-10-CM | POA: Diagnosis not present

## 2017-05-25 DIAGNOSIS — Z2821 Immunization not carried out because of patient refusal: Secondary | ICD-10-CM | POA: Diagnosis not present

## 2017-05-25 DIAGNOSIS — I700 Atherosclerosis of aorta: Secondary | ICD-10-CM | POA: Diagnosis not present

## 2017-05-25 DIAGNOSIS — Z6835 Body mass index (BMI) 35.0-35.9, adult: Secondary | ICD-10-CM | POA: Diagnosis not present

## 2017-05-25 DIAGNOSIS — E1165 Type 2 diabetes mellitus with hyperglycemia: Secondary | ICD-10-CM | POA: Diagnosis not present

## 2017-05-25 DIAGNOSIS — Z299 Encounter for prophylactic measures, unspecified: Secondary | ICD-10-CM | POA: Diagnosis not present

## 2017-05-25 DIAGNOSIS — J40 Bronchitis, not specified as acute or chronic: Secondary | ICD-10-CM | POA: Diagnosis not present

## 2017-05-25 DIAGNOSIS — F39 Unspecified mood [affective] disorder: Secondary | ICD-10-CM | POA: Diagnosis not present

## 2017-05-25 DIAGNOSIS — R0602 Shortness of breath: Secondary | ICD-10-CM | POA: Diagnosis not present

## 2017-05-25 DIAGNOSIS — I10 Essential (primary) hypertension: Secondary | ICD-10-CM | POA: Diagnosis not present

## 2017-05-25 DIAGNOSIS — Z789 Other specified health status: Secondary | ICD-10-CM | POA: Diagnosis not present

## 2017-05-25 DIAGNOSIS — R0600 Dyspnea, unspecified: Secondary | ICD-10-CM | POA: Diagnosis not present

## 2017-06-01 DIAGNOSIS — R0602 Shortness of breath: Secondary | ICD-10-CM | POA: Diagnosis not present

## 2017-06-08 DIAGNOSIS — E7800 Pure hypercholesterolemia, unspecified: Secondary | ICD-10-CM | POA: Diagnosis not present

## 2017-06-08 DIAGNOSIS — R0600 Dyspnea, unspecified: Secondary | ICD-10-CM | POA: Diagnosis not present

## 2017-06-08 DIAGNOSIS — F258 Other schizoaffective disorders: Secondary | ICD-10-CM | POA: Diagnosis not present

## 2017-06-08 DIAGNOSIS — Z6835 Body mass index (BMI) 35.0-35.9, adult: Secondary | ICD-10-CM | POA: Diagnosis not present

## 2017-06-08 DIAGNOSIS — F411 Generalized anxiety disorder: Secondary | ICD-10-CM | POA: Diagnosis not present

## 2017-06-08 DIAGNOSIS — F39 Unspecified mood [affective] disorder: Secondary | ICD-10-CM | POA: Diagnosis not present

## 2017-06-08 DIAGNOSIS — E1165 Type 2 diabetes mellitus with hyperglycemia: Secondary | ICD-10-CM | POA: Diagnosis not present

## 2017-06-08 DIAGNOSIS — Z299 Encounter for prophylactic measures, unspecified: Secondary | ICD-10-CM | POA: Diagnosis not present

## 2017-06-18 ENCOUNTER — Encounter: Payer: Self-pay | Admitting: Pulmonary Disease

## 2017-06-18 ENCOUNTER — Ambulatory Visit: Payer: Medicare Other | Admitting: Pulmonary Disease

## 2017-06-18 VITALS — BP 124/58 | HR 71 | Ht 64.0 in | Wt 203.0 lb

## 2017-06-18 DIAGNOSIS — R059 Cough, unspecified: Secondary | ICD-10-CM

## 2017-06-18 DIAGNOSIS — J411 Mucopurulent chronic bronchitis: Secondary | ICD-10-CM

## 2017-06-18 DIAGNOSIS — R05 Cough: Secondary | ICD-10-CM

## 2017-06-18 DIAGNOSIS — J301 Allergic rhinitis due to pollen: Secondary | ICD-10-CM

## 2017-06-18 DIAGNOSIS — R051 Acute cough: Secondary | ICD-10-CM

## 2017-06-18 LAB — NITRIC OXIDE: NITRIC OXIDE: 33

## 2017-06-18 NOTE — Progress Notes (Signed)
Subjective:   PATIENT ID: Mariah Hoover GENDER: female DOB: [birth year], MRN: [id number]  Synopsis: Referred in April 2019 for chronic cough and shortness of breath  HPI  Chief Complaint  Patient presents with   Consult    Dr. Woody Seller, since 2018 Chornic cough , dyspnea, change in voice    [name] is here to see me for recurrent bronchitis and cough.  She says her son prompted the visit.  She says she is currently doing well. > she follows with [facility] internal medicine and sees Dr. Woody Seller and Cyndi Bender there > she has had recurrent episodes of bronchitis treated with prednisone which helps > she says that she has had two X-rays and was told she had scars on the lung > she says once she said there was pneumonia in the left lung > her symptoms started in 12/2016, though in July 2018 and November 2018 she bronchitis > she needed Zpacks for these illnesses too  She says she never smoked, though her family did. She had pneumonia as a child: she remembers having an overwhelming fever for days and stayed in the bed for a week.   She had sinus problems for most of her adult life.  She has dealt with that for a long time.   She says that over the years she thinks she had some frequent bouts of respiratory infections, though for the last 2-3 years she has experienced a lot of bronchitis. She says that she has a lot of allergies that cause her sinus symptoms.   She worked as a Scientist, water quality.    She stopped taking lisinopril several years ago  Past Medical History:  Diagnosis Date   Bipolar I disorder with mania ([place]) 07/31/2011   Hyperlipidemia    Hypertension      History reviewed. No pertinent family history.   Social History   Socioeconomic History   Marital status: Married    Spouse name: Not on file   Number of children: Not on file   Years of education: Not on file   Highest education level: Not on file  Occupational History   Not on file  Social Needs   Financial  resource strain: Not on file   Food insecurity:    Worry: Not on file    Inability: Not on file   Transportation needs:    Medical: Not on file    Non-medical: Not on file  Tobacco Use   Smoking status: Passive Smoke Exposure - Never Smoker   Smokeless tobacco: Never Used  Substance and Sexual Activity   Alcohol use: Yes    Alcohol/week: 0.0 oz   Drug use: Not on file   Sexual activity: Not on file  Lifestyle   Physical activity:    Days per week: Not on file    Minutes per session: Not on file   Stress: Not on file  Relationships   Social connections:    Talks on phone: Not on file    Gets together: Not on file    Attends religious service: Not on file    Active member of club or organization: Not on file    Attends meetings of clubs or organizations: Not on file    Relationship status: Not on file   Intimate partner violence:    Fear of current or ex partner: Not on file    Emotionally abused: Not on file    Physically abused: Not on file    Forced sexual activity:  Not on file  Other Topics Concern   Not on file  Social History Narrative   Not on file     Allergies  Allergen Reactions   Penicillins Anaphylaxis   Codeine Itching and Rash   Latex Itching and Rash     Outpatient Medications Prior to Visit  Medication Sig Dispense Refill   acetaminophen (TYLENOL) 500 MG tablet Take 500 mg by mouth daily.     Cinnamon 500 MG capsule Take 500 mg by mouth daily.     loratadine (CLARITIN) 10 MG tablet Take 10 mg by mouth daily.     LORazepam (ATIVAN) 1 MG tablet Take 0.5-1 mg by mouth 2 (two) times daily as needed.     rosuvastatin (CRESTOR) 10 MG tablet      ARIPiprazole (ABILIFY) 5 MG tablet Take 5 mg by mouth daily.     fish oil-omega-3 fatty acids 1000 MG capsule Take 1 g by mouth daily.     levothyroxine (SYNTHROID, LEVOTHROID) 50 MCG tablet Take 50 mcg by mouth every morning.     lisinopril-hydrochlorothiazide (PRINZIDE,ZESTORETIC)  20-12.5 MG per tablet Take 1 tablet by mouth 2 (two) times daily.     metFORMIN (GLUCOPHAGE) 500 MG tablet Take 500 mg by mouth 2 (two) times daily.     risperiDONE (RISPERDAL) 0.25 MG tablet Take 1 tablet at bedtime for one week,then take  2 tablets at bedtime 60 tablet 3   simvastatin (ZOCOR) 40 MG tablet Take 40 mg by mouth every evening.     No facility-administered medications prior to visit.     Review of Systems  Constitutional: Negative for chills, fever, malaise/fatigue and weight loss.  HENT: Negative for congestion, nosebleeds, sinus pain and sore throat.   Eyes: Negative for photophobia, pain and discharge.  Respiratory: Positive for cough and shortness of breath. Negative for hemoptysis, sputum production and wheezing.   Cardiovascular: Negative for chest pain, palpitations, orthopnea and leg swelling.  Gastrointestinal: Negative for abdominal pain, constipation, diarrhea, nausea and vomiting.  Genitourinary: Negative for dysuria, frequency, hematuria and urgency.  Musculoskeletal: Negative for back pain, joint pain, myalgias and neck pain.  Skin: Negative for itching and rash.  Neurological: Negative for tingling, tremors, sensory change, speech change, focal weakness, seizures, weakness and headaches.  Psychiatric/Behavioral: Negative for memory loss, substance abuse and suicidal ideas. The patient is not nervous/anxious.       Objective:  Physical Exam   Vitals:   06/18/17 1055 06/18/17 1056  BP:  (!) 124/58  Pulse:  71  SpO2:  95%  Weight: 203 lb (92.1 kg)   Height: 5\' 4"  (1.626 m)   RA  Gen: well appearing, no acute distress HENT: NCAT, OP clear, neck supple without masses Eyes: PERRL, EOMi Lymph: no cervical lymphadenopathy PULM: Few crackles bases, some scant wheezing B CV: RRR,slight systolic murmur, no JVD GI: BS+, soft, nontender, no hsm Derm: chronic venous stasis changes, no rash or skin breakdown MSK: normal bulk and tone Neuro: A&Ox4, CN  II-XII intact, strength 5/5 in all 4 extremities Psyche: normal mood and affect   CBC    Component Value Date/Time   WBC 6.5 07/31/2011 0030   RBC 3.69 (L) 07/31/2011 0030   HGB 11.8 (L) 07/31/2011 0030   HCT 34.1 (L) 07/31/2011 0030   PLT 345 07/31/2011 0030   MCV 92.4 07/31/2011 0030   MCH 32.0 07/31/2011 0030   MCHC 34.6 07/31/2011 0030   RDW 12.2 07/31/2011 0030   LYMPHSABS 2.6 07/31/2011 0030  MONOABS 0.6 07/31/2011 0030   EOSABS 0.3 07/31/2011 0030   BASOSABS 0.0 07/31/2011 0030     Chest imaging: None available  PFT:  Exhaled Nitric Oxide: 06/2017 33 ppm  Labs:  Path:  Echo:  Heart Catheterization:       Assessment & Plan:   Bronchitis, mucopurulent recurrent (HCC)  Cough - Plan: Pulmonary function test, CT Chest High Resolution, POCT EXHALED NITRIC OXIDE, Nitric oxide  Allergic rhinitis due to pollen, unspecified seasonality  Discussion: Ms. Abila comes to my clinic today for evaluation of recurrent cough and wheeze which is required treatment with prednisone and antibiotics for the last several years.  She has a history of childhood pneumonia and recurrent respiratory infections and allergies.  I explained to her that the differential diagnosis includes bronchiectasis, severe persistent asthma which is been previously undiagnosed and causing recurrent exacerbations, or less likely an atypical infection.  We need to get imaging of her chest to look for evidence of bronchiectasis or other abnormalities and a lung function test to assess for asthma.  We will also check an exhaled nitric oxide test today.  It should be noted that she does not take lisinopril.  Plan: Recurrent episodes of bronchitis and cough: As I explained today I think this is probably bronchiectasis though I also suspect asthma.  We need to get test to confirm both of these diagnoses. We will arrange for a lung function test and high-resolution CT scan of the chest at [facility] taking your medicines as you are doing We will check an exhaled nitric oxide test today to assess for inflammation in your lungs  We will see you back in 1-2 weeks to go over these results    Current Outpatient Medications:    acetaminophen (TYLENOL) 500 MG tablet, Take 500 mg by mouth daily., Disp: , Rfl:    Cinnamon 500 MG capsule, Take 500 mg by mouth daily., Disp: , Rfl:    loratadine (CLARITIN) 10 MG tablet, Take 10 mg by mouth daily., Disp: , Rfl:    LORazepam (ATIVAN) 1 MG tablet, Take 0.5-1 mg by mouth 2 (two) times daily as needed., Disp: , Rfl:    rosuvastatin (CRESTOR) 10 MG tablet, , Disp: , Rfl:

## 2017-06-18 NOTE — Patient Instructions (Signed)
Recurrent episodes of bronchitis and cough: As I explained today I think this is probably bronchiectasis though I also suspect asthma.  We need to get test to confirm both of these diagnoses. We will arrange for a lung function test and high-resolution CT scan of the chest at [facility] taking your medicines as you are doing We will check an exhaled nitric oxide test today to assess for inflammation in your lungs  We will see you back in 1-2 weeks to go over these results

## 2017-06-26 ENCOUNTER — Encounter: Payer: Self-pay | Admitting: Cardiology

## 2017-06-26 ENCOUNTER — Ambulatory Visit (INDEPENDENT_AMBULATORY_CARE_PROVIDER_SITE_OTHER): Payer: Medicare Other | Admitting: Cardiology

## 2017-06-26 VITALS — BP 138/78 | HR 73 | Ht 64.5 in | Wt 200.0 lb

## 2017-06-26 DIAGNOSIS — E119 Type 2 diabetes mellitus without complications: Secondary | ICD-10-CM | POA: Diagnosis not present

## 2017-06-26 DIAGNOSIS — Z8679 Personal history of other diseases of the circulatory system: Secondary | ICD-10-CM | POA: Diagnosis not present

## 2017-06-26 DIAGNOSIS — E782 Mixed hyperlipidemia: Secondary | ICD-10-CM

## 2017-06-26 DIAGNOSIS — R0609 Other forms of dyspnea: Secondary | ICD-10-CM

## 2017-06-26 NOTE — Progress Notes (Signed)
Cardiology Office Note  Date: 06/26/2017   ID: Mariah Hoover, DOB 08-07-1940, MRN [id number]  PCP: Glenda Chroman, MD  Consulting Cardiologist: Rozann Lesches, MD   Chief Complaint  Patient presents with  . Shortness of Breath    History of Present Illness: Mariah Hoover is a 77 y.o. female referred for cardiology consultation by Dr. Woody Seller for the evaluation of shortness of breath.  She is here today with her husband.  She states that over the last 6 months or so she has had increased dyspnea on exertion and recurring cough, has been diagnosed by PCP with bronchitis and just recently referred for Pulmonary evaluation.  She actually states that she has had longer term spells of cough and wheezing and has generally improved with steroids and antibiotics.  She saw Dr. Lake Bells on April 4 and at this point concern is for further diagnosis of asthma and possibly bronchiectasis.  High-resolution chest CT is pending.  She does not report any definite angina symptoms.  Cardiac risk factors include type 2 diabetes mellitus, hypertension, and hyperlipidemia.  Recent echocardiogram done at [facility] internal medicine demonstrated LVEF 50-55% with no major valvular abnormalities and normal RVSP.  Recent ECG was nonspecific as described below.  Although she was referred with question of dyspnea on exertion, her symptoms sound very much pulmonary in etiology.  I did talk with her about the possibility of screening for underlying ischemic heart disease in light of her risk factors, but for now she stated that she was comfortable pursuing further pulmonary workup and would consider additional cardiac testing if necessary.  Past Medical History:  Diagnosis Date  . Anxiety   . Bipolar disorder ([place]) 07/31/2011  . Chronic back pain   . Essential hypertension   . Hyperlipidemia   . Mood disorder ([place])   . Type 2 diabetes mellitus ([place])     Past Surgical History:  Procedure Laterality Date  . BACK  SURGERY    . FOOT SURGERY     20 year ago   . TONSILLECTOMY     77 yrs old   . TUBAL LIGATION     30 years ago     Current Outpatient Medications  Medication Sig Dispense Refill  . acetaminophen (TYLENOL) 500 MG tablet Take 500 mg by mouth daily.    . Cinnamon 500 MG capsule Take 500 mg by mouth daily.    Marland Kitchen loratadine (CLARITIN) 10 MG tablet Take 10 mg by mouth as needed.     Marland Kitchen LORazepam (ATIVAN) 1 MG tablet Take 0.5-1 mg by mouth 2 (two) times daily as needed.    . rosuvastatin (CRESTOR) 10 MG tablet      No current facility-administered medications for this visit.    Allergies:  Penicillins; Lipitor [atorvastatin calcium]; Zocor [simvastatin]; Codeine; Latex; Perphenazine-amitriptyline; and Risperdal [risperidone]   Social History: The patient  reports that she is a non-smoker but has been exposed to tobacco smoke. She has never used smokeless tobacco. She reports that she drank alcohol. She reports that she does not use drugs.   Family History: The patient's family history includes Cancer in her brother and sister; Diabetes Mellitus II in her father.   ROS:  Please see the history of present illness. Otherwise, complete review of systems is negative for orthopnea or PND, no hemoptysis.  All other systems are reviewed and negative.   Physical Exam: VS:  BP 138/78   Pulse 73   Ht 5' 4.5" (1.638 m)  Wt 200 lb (90.7 kg)   SpO2 95%   BMI 33.80 kg/m , BMI Body mass index is 33.8 kg/m.  Wt Readings from Last 3 Encounters:  06/26/17 200 lb (90.7 kg)  06/18/17 203 lb (92.1 kg)  07/30/11 181 lb (82.1 kg)    General: Patient appears comfortable at rest. HEENT: Conjunctiva and lids normal, oropharynx clear. Neck: Supple, no elevated JVP or carotid bruits, no thyromegaly. Lungs: No frank wheezing or rales, nonlabored breathing at rest. Cardiac: Regular rate and rhythm, no S3, soft systolic murmur, no diastolic murmur, no pericardial rub. Abdomen: Soft, nontender, bowel sounds  present. Extremities: No pitting edema, venous stasis, distal pulses 2+. Skin: Warm and dry. Musculoskeletal: No kyphosis. Neuropsychiatric: Alert and oriented x3, affect grossly appropriate.  ECG: I personally reviewed the tracing from 05/25/2017 which showed normal sinus rhythm with nonspecific T wave changes.  Other Studies Reviewed Today:  Echocardiogram 06/01/2017 [facility] internal medicine): Mild LVH with LVEF 50-55%, ungraded diastolic dysfunction, normal right ventricular contraction, normal left atrial chamber size, mild aortic regurgitation, mildly thickened mitral leaflets with trivial mitral regurgitation and no stenosis, trivial tricuspid regurgitation with normal RVSP estimated 18 mmHg, no pericardial effusion.  Assessment and Plan:  1.  Dyspnea on exertion with recurring cough and wheezing.  She is in the process of undergoing further pulmonary assessment by Dr. Lake Bells, high-resolution chest CT pending at this time.  There is suspicion that she might have asthma and potentially bronchiectasis.  She was referred for cardiology evaluation by PCP.  It is certainly possible that she has underlying coronary atherosclerosis, but her present symptoms are not classic for this.  Cardiac risk factors include type 2 diabetes mellitus, hyperlipidemia, and hypertension.  I did talk with her about the possibility of doing a screening Myoview study, but at this point she preferred to continue on with her pulmonary evaluation first and might consider further cardiac testing at a later date.  I told her that I would communicate with her PCP via note.  2.  Hyperlipidemia with history of statin intolerance.  She is currently tolerating Crestor.  3.  Essential hypertension by history.  Not on ACE inhibitor.  4.  Type 2 diabetes mellitus by history, diet managed at this point.  Current medicines were reviewed with the patient today.  Disposition: We can see her back as needed for further cardiac  testing.  Signed, Satira Sark, MD, [facility] 06/26/2017 11:05 AM    [facility] [place], [place], [place] [address] Phone: [phone]; Fax: [phone]

## 2017-06-26 NOTE — Patient Instructions (Addendum)
Medication Instructions:   Your physician recommends that you continue on your current medications as directed. Please refer to the Current Medication list given to you today.  Labwork:  NONE  Testing/Procedures:  NONE  Follow-Up:  Your physician recommends that you schedule a follow-up appointment in: as needed.   Any Other Special Instructions Will Be Listed Below (If Applicable).  If you need a refill on your cardiac medications before your next appointment, please call your pharmacy. 

## 2017-07-07 ENCOUNTER — Ambulatory Visit (HOSPITAL_COMMUNITY)
Admission: RE | Admit: 2017-07-07 | Discharge: 2017-07-07 | Disposition: A | Discharge status: Home / Self Care | Payer: Medicare Other | Source: Ambulatory Visit | Attending: Pulmonary Disease | Admitting: Pulmonary Disease

## 2017-07-07 DIAGNOSIS — I2510 Atherosclerotic heart disease of native coronary artery without angina pectoris: Secondary | ICD-10-CM | POA: Insufficient documentation

## 2017-07-07 DIAGNOSIS — R05 Cough: Secondary | ICD-10-CM | POA: Insufficient documentation

## 2017-07-07 DIAGNOSIS — I700 Atherosclerosis of aorta: Secondary | ICD-10-CM | POA: Insufficient documentation

## 2017-07-07 DIAGNOSIS — R932 Abnormal findings on diagnostic imaging of liver and biliary tract: Secondary | ICD-10-CM | POA: Insufficient documentation

## 2017-07-07 DIAGNOSIS — J208 Acute bronchitis due to other specified organisms: Secondary | ICD-10-CM | POA: Diagnosis not present

## 2017-07-07 DIAGNOSIS — R059 Cough, unspecified: Secondary | ICD-10-CM

## 2017-07-07 LAB — PULMONARY FUNCTION TEST
DL/VA % PRED: 80 %
DL/VA: 3.86 ml/min/mmHg/L
DLCO UNC % PRED: 74 %
DLCO UNC: 18.12 ml/min/mmHg
FEF 25-75 PRE: 0.84 L/s
FEF 25-75 Post: 1.38 L/sec
FEF2575-%CHANGE-POST: 64 %
FEF2575-%PRED-POST: 88 %
FEF2575-%PRED-PRE: 53 %
FEV1-%CHANGE-POST: 10 %
FEV1-%PRED-POST: 88 %
FEV1-%Pred-Pre: 79 %
FEV1-POST: 1.8 L
FEV1-Pre: 1.63 L
FEV1FVC-%CHANGE-POST: 14 %
FEV1FVC-%PRED-PRE: 91 %
FEV6-%CHANGE-POST: 0 %
FEV6-%PRED-POST: 88 %
FEV6-%Pred-Pre: 88 %
FEV6-PRE: 2.29 L
FEV6-Post: 2.3 L
FEV6FVC-%CHANGE-POST: 3 %
FEV6FVC-%PRED-PRE: 100 %
FEV6FVC-%Pred-Post: 103 %
FVC-%CHANGE-POST: -3 %
FVC-%PRED-POST: 85 %
FVC-%Pred-Pre: 88 %
FVC-Post: 2.33 L
FVC-Pre: 2.4 L
Post FEV1/FVC ratio: 77 %
Post FEV6/FVC ratio: 98 %
Pre FEV1/FVC ratio: 68 %
Pre FEV6/FVC Ratio: 95 %
RV % PRED: 123 %
RV: 2.87 L
TLC % pred: 107 %
TLC: 5.41 L

## 2017-07-07 MED ORDER — ALBUTEROL SULFATE (2.5 MG/3ML) 0.083% IN NEBU
2.5000 mg | INHALATION_SOLUTION | Freq: Once | RESPIRATORY_TRACT | Status: AC
Start: 2017-07-07 — End: 2017-07-07
  Administered 2017-07-07: 2.5 mg via RESPIRATORY_TRACT

## 2017-07-22 ENCOUNTER — Encounter: Payer: Self-pay | Admitting: Pulmonary Disease

## 2017-07-22 ENCOUNTER — Other Ambulatory Visit: Payer: Medicare Other

## 2017-07-22 ENCOUNTER — Ambulatory Visit (INDEPENDENT_AMBULATORY_CARE_PROVIDER_SITE_OTHER): Payer: Medicare Other | Admitting: Pulmonary Disease

## 2017-07-22 VITALS — BP 168/70 | HR 94 | Ht 64.0 in | Wt 201.8 lb

## 2017-07-22 DIAGNOSIS — J45909 Unspecified asthma, uncomplicated: Secondary | ICD-10-CM

## 2017-07-22 MED ORDER — MONTELUKAST SODIUM 10 MG PO TABS: 10.0000 mg | ORAL_TABLET | Freq: Every day | ORAL | 2 refills | Status: AC

## 2017-07-22 MED ORDER — ALBUTEROL SULFATE HFA 108 (90 BASE) MCG/ACT IN AERS: 2.0000 | INHALATION_SPRAY | RESPIRATORY_TRACT | 2 refills | Status: AC | PRN

## 2017-07-22 NOTE — Progress Notes (Signed)
Subjective:   PATIENT ID: Mariah Hoover GENDER: female DOB: Nov 27, 1940, MRN: [id number]  Synopsis: Referred in April 2019 for chronic cough and shortness of breath  HPI  Chief Complaint  Patient presents with  . Follow-up    2 week ROV, results review,  cough (clear thick mucous)    She says that she still has a cough with clear mucus production.  This has been an off and on problem for her lately.    She notes some water damage in the house this year.  They know they have old carpet.  She says taht when they cleaned her carpet.    Yesterday she had a flare of the coughing when she was around some carpet which was dusty.    Past Medical History:  Diagnosis Date  . Anxiety   . Bipolar disorder ([place]) 07/31/2011  . Chronic back pain   . Essential hypertension   . Hyperlipidemia   . Mood disorder ([place])   . Type 2 diabetes mellitus ([place])       Review of Systems  Constitutional: Negative for chills, fever, malaise/fatigue and weight loss.  HENT: Negative for congestion, nosebleeds, sinus pain and sore throat.   Eyes: Negative for photophobia, pain and discharge.  Respiratory: Positive for cough and shortness of breath. Negative for hemoptysis, sputum production and wheezing.   Cardiovascular: Negative for chest pain, palpitations, orthopnea and leg swelling.  Gastrointestinal: Negative for abdominal pain, constipation, diarrhea, nausea and vomiting.  Genitourinary: Negative for dysuria, frequency, hematuria and urgency.  Musculoskeletal: Negative for back pain, joint pain, myalgias and neck pain.  Skin: Negative for itching and rash.  Neurological: Negative for tingling, tremors, sensory change, speech change, focal weakness, seizures, weakness and headaches.  Psychiatric/Behavioral: Negative for memory loss, substance abuse and suicidal ideas. The patient is not nervous/anxious.       Objective:  Physical Exam   Vitals:   07/22/17 1512  BP: (!) 168/70    Pulse: 94  SpO2: 93%  Weight: 201 lb 12.8 oz (91.5 kg)  Height: 5\' 4"  (1.626 m)  RA  General:  Resting comfortably in bed HENT: NCAT OP clear PULM: Mild wheezing, normal air movement B, normal effort CV: RRR, no mgr GI: BS+, soft, nontender MSK: normal bulk and tone Neuro: awake, alert, no distress, MAEW   CBC    Component Value Date/Time   WBC 6.5 07/31/2011 0030   RBC 3.69 (L) 07/31/2011 0030   HGB 11.8 (L) 07/31/2011 0030   HCT 34.1 (L) 07/31/2011 0030   PLT 345 07/31/2011 0030   MCV 92.4 07/31/2011 0030   MCH 32.0 07/31/2011 0030   MCHC 34.6 07/31/2011 0030   RDW 12.2 07/31/2011 0030   LYMPHSABS 2.6 07/31/2011 0030   MONOABS 0.6 07/31/2011 0030   EOSABS 0.3 07/31/2011 0030   BASOSABS 0.0 07/31/2011 0030     Chest imaging: 06/2017 HRCT images reviewed> images independently reviewed showing some air trapping and some mucus plugging in the bases but no bronchiectasis or interstitial lung disease   PFT: 06/2017 > ratio 68% FEV1 1.63 L 79% predicted improved to 1.80 L 10% change, total lung capacity 5.4 L 107% predicted, DLCO 18 mL 74% predicted  Exhaled Nitric Oxide: 06/2017 33 ppm  Labs:  Path:  Echo:  Heart Catheterization:       Assessment & Plan:   Asthma, unspecified asthma severity, unspecified whether complicated, unspecified whether persistent - Plan: IgE, CBC w/Diff   She has mild persistent asthma.  Unfortunately she is not interested in any sort of allergy testing right now despite my encouraging this.  We will check a serum IgE and serum eosinophil count to see if this is an atopic process.  She is also not interested in taking any sort of inhaled therapy.  I advised inhaled steroids but she prefer not to do this.  We talked about using Singulair so we will try that.  Plan: Mild persistent asthma: Take Singulair 10 mg daily no matter how you feel (generic form of this is fine) Use albuterol 2 puffs every 4-6 hours as needed for chest  tightness wheezing or shortness of breath Return to see a nurse practitioner in 2 weeks, if you are still having symptoms of chest congestion cough or mucus production that I think is worthwhile to consider using an inhaled corticosteroid like Qvar or going for allergy testing  We will see you back in 2 weeks with a nurse practitioner  18 minutes spent with patient, 25-minute visit   Current Outpatient Medications:  .  acetaminophen (TYLENOL) 500 MG tablet, Take 500 mg by mouth daily., Disp: , Rfl:  .  Cinnamon 500 MG capsule, Take 500 mg by mouth daily., Disp: , Rfl:  .  Dextromethorphan Polistirex (DELSYM PO), Take by mouth., Disp: , Rfl:  .  dextromethorphan-guaiFENesin (MUCINEX DM) 30-600 MG 12hr tablet, Take 1 tablet by mouth 2 (two) times daily., Disp: , Rfl:  .  loratadine (CLARITIN) 10 MG tablet, Take 10 mg by mouth as needed. , Disp: , Rfl:  .  LORazepam (ATIVAN) 1 MG tablet, Take 0.5-1 mg by mouth 2 (two) times daily as needed., Disp: , Rfl:  .  rosuvastatin (CRESTOR) 10 MG tablet, , Disp: , Rfl:  .  albuterol (PROAIR HFA) 108 (90 Base) MCG/ACT inhaler, Inhale 2 puffs into the lungs every 4 (four) hours as needed for wheezing or shortness of breath (dyspnea)., Disp: 1 Inhaler, Rfl: 2 .  montelukast (SINGULAIR) 10 MG tablet, Take 1 tablet (10 mg total) by mouth at bedtime., Disp: 30 tablet, Rfl: 2

## 2017-07-22 NOTE — Patient Instructions (Signed)
Mild persistent asthma: Take Singulair 10 mg daily no matter how you feel (generic form of this is fine) Use albuterol 2 puffs every 4-6 hours as needed for chest tightness wheezing or shortness of breath Return to see a nurse practitioner in 2 weeks, if you are still having symptoms of chest congestion cough or mucus production that I think is worthwhile to consider using an inhaled corticosteroid like Qvar or going for allergy testing  We will see you back in 2 weeks with a nurse practitioner

## 2017-07-29 ENCOUNTER — Ambulatory Visit: Payer: Self-pay | Admitting: Pulmonary Disease

## 2017-08-25 ENCOUNTER — Ambulatory Visit: Payer: Self-pay | Admitting: Pulmonary Disease

## 2017-11-10 DIAGNOSIS — H52223 Regular astigmatism, bilateral: Secondary | ICD-10-CM | POA: Diagnosis not present

## 2017-11-10 DIAGNOSIS — H2513 Age-related nuclear cataract, bilateral: Secondary | ICD-10-CM | POA: Diagnosis not present

## 2017-11-10 DIAGNOSIS — H5211 Myopia, right eye: Secondary | ICD-10-CM | POA: Diagnosis not present

## 2017-12-03 DIAGNOSIS — F319 Bipolar disorder, unspecified: Secondary | ICD-10-CM | POA: Diagnosis not present

## 2017-12-03 DIAGNOSIS — Z299 Encounter for prophylactic measures, unspecified: Secondary | ICD-10-CM | POA: Diagnosis not present

## 2017-12-03 DIAGNOSIS — F39 Unspecified mood [affective] disorder: Secondary | ICD-10-CM | POA: Diagnosis not present

## 2017-12-03 DIAGNOSIS — E7800 Pure hypercholesterolemia, unspecified: Secondary | ICD-10-CM | POA: Diagnosis not present

## 2017-12-03 DIAGNOSIS — Z6835 Body mass index (BMI) 35.0-35.9, adult: Secondary | ICD-10-CM | POA: Diagnosis not present

## 2017-12-03 DIAGNOSIS — I10 Essential (primary) hypertension: Secondary | ICD-10-CM | POA: Diagnosis not present

## 2017-12-07 DIAGNOSIS — Z1231 Encounter for screening mammogram for malignant neoplasm of breast: Secondary | ICD-10-CM | POA: Diagnosis not present

## 2017-12-23 ENCOUNTER — Other Ambulatory Visit: Payer: Self-pay | Admitting: Pulmonary Disease

## 2017-12-28 DIAGNOSIS — Z299 Encounter for prophylactic measures, unspecified: Secondary | ICD-10-CM | POA: Diagnosis not present

## 2017-12-28 DIAGNOSIS — Z6835 Body mass index (BMI) 35.0-35.9, adult: Secondary | ICD-10-CM | POA: Diagnosis not present

## 2017-12-28 DIAGNOSIS — Z23 Encounter for immunization: Secondary | ICD-10-CM | POA: Diagnosis not present

## 2017-12-28 DIAGNOSIS — R300 Dysuria: Secondary | ICD-10-CM | POA: Diagnosis not present

## 2017-12-28 DIAGNOSIS — F39 Unspecified mood [affective] disorder: Secondary | ICD-10-CM | POA: Diagnosis not present

## 2017-12-28 DIAGNOSIS — I10 Essential (primary) hypertension: Secondary | ICD-10-CM | POA: Diagnosis not present

## 2017-12-28 DIAGNOSIS — N390 Urinary tract infection, site not specified: Secondary | ICD-10-CM | POA: Diagnosis not present

## 2017-12-28 DIAGNOSIS — F411 Generalized anxiety disorder: Secondary | ICD-10-CM | POA: Diagnosis not present

## 2018-01-06 DIAGNOSIS — Z1339 Encounter for screening examination for other mental health and behavioral disorders: Secondary | ICD-10-CM | POA: Diagnosis not present

## 2018-01-06 DIAGNOSIS — E039 Hypothyroidism, unspecified: Secondary | ICD-10-CM | POA: Diagnosis not present

## 2018-01-06 DIAGNOSIS — Z1331 Encounter for screening for depression: Secondary | ICD-10-CM | POA: Diagnosis not present

## 2018-01-06 DIAGNOSIS — E1165 Type 2 diabetes mellitus with hyperglycemia: Secondary | ICD-10-CM | POA: Diagnosis not present

## 2018-01-06 DIAGNOSIS — Z0000 Encounter for general adult medical examination without abnormal findings: Secondary | ICD-10-CM | POA: Diagnosis not present

## 2018-01-06 DIAGNOSIS — E7800 Pure hypercholesterolemia, unspecified: Secondary | ICD-10-CM | POA: Diagnosis not present

## 2018-01-06 DIAGNOSIS — Z1211 Encounter for screening for malignant neoplasm of colon: Secondary | ICD-10-CM | POA: Diagnosis not present

## 2018-01-06 DIAGNOSIS — Z79899 Other long term (current) drug therapy: Secondary | ICD-10-CM | POA: Diagnosis not present

## 2018-01-06 DIAGNOSIS — Z6834 Body mass index (BMI) 34.0-34.9, adult: Secondary | ICD-10-CM | POA: Diagnosis not present

## 2018-01-06 DIAGNOSIS — Z7189 Other specified counseling: Secondary | ICD-10-CM | POA: Diagnosis not present

## 2018-01-06 DIAGNOSIS — F258 Other schizoaffective disorders: Secondary | ICD-10-CM | POA: Diagnosis not present

## 2018-01-06 DIAGNOSIS — F39 Unspecified mood [affective] disorder: Secondary | ICD-10-CM | POA: Diagnosis not present

## 2018-01-06 DIAGNOSIS — Z299 Encounter for prophylactic measures, unspecified: Secondary | ICD-10-CM | POA: Diagnosis not present

## 2018-04-26 DIAGNOSIS — Z789 Other specified health status: Secondary | ICD-10-CM | POA: Diagnosis not present

## 2018-04-26 DIAGNOSIS — I10 Essential (primary) hypertension: Secondary | ICD-10-CM | POA: Diagnosis not present

## 2018-04-26 DIAGNOSIS — G4700 Insomnia, unspecified: Secondary | ICD-10-CM | POA: Diagnosis not present

## 2018-04-26 DIAGNOSIS — E7800 Pure hypercholesterolemia, unspecified: Secondary | ICD-10-CM | POA: Diagnosis not present

## 2018-04-26 DIAGNOSIS — Z6834 Body mass index (BMI) 34.0-34.9, adult: Secondary | ICD-10-CM | POA: Diagnosis not present

## 2018-04-26 DIAGNOSIS — Z299 Encounter for prophylactic measures, unspecified: Secondary | ICD-10-CM | POA: Diagnosis not present

## 2018-11-12 IMAGING — CT CT CHEST HIGH RESOLUTION W/O CM
2 of 5 series · 15 of 36 positions shown, 18 images · non-contrast
Comparison: Chest radiograph 05/25/2017.

CLINICAL DATA: Recurrent bronchitis with cough.

EXAM:
CT CHEST WITHOUT CONTRAST
TECHNIQUE: Multidetector CT imaging of the chest was performed following the
standard protocol without intravenous contrast. High resolution
imaging of the lungs, as well as inspiratory and expiratory imaging,
was performed.

[Series 3: thorax · axial · 0.71mm/px · z∈[-349,-59]mm · 12 of 161 slices shown, 15 images]
[im 8/161  mediastinal]
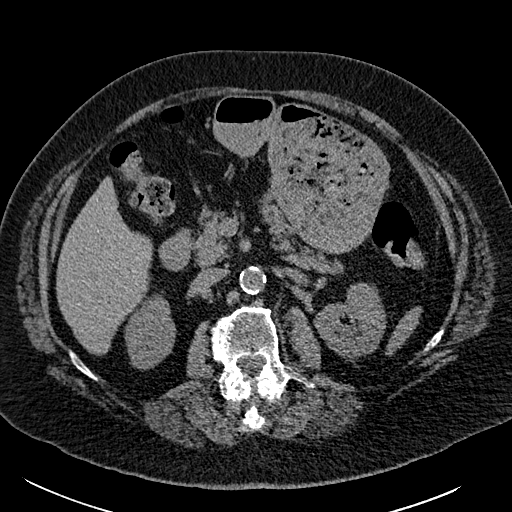
[im 8/161  lung]
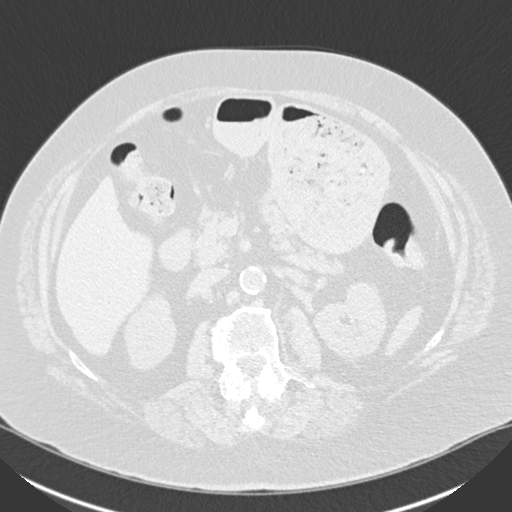
[im 23/161  lung]
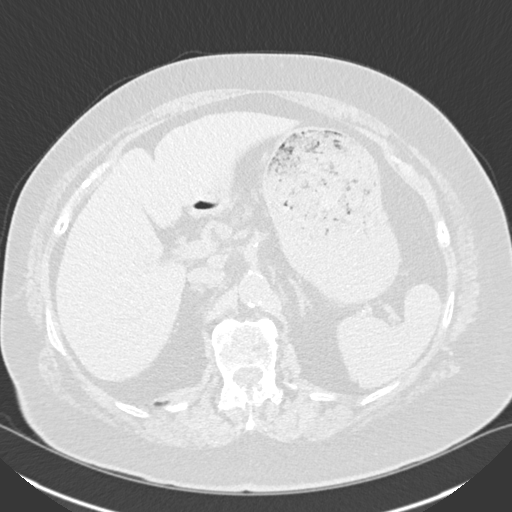
[im 39/161  lung]
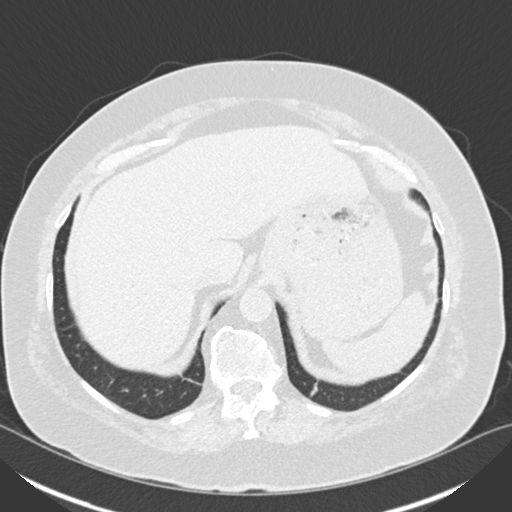
[im 46/161  lung]
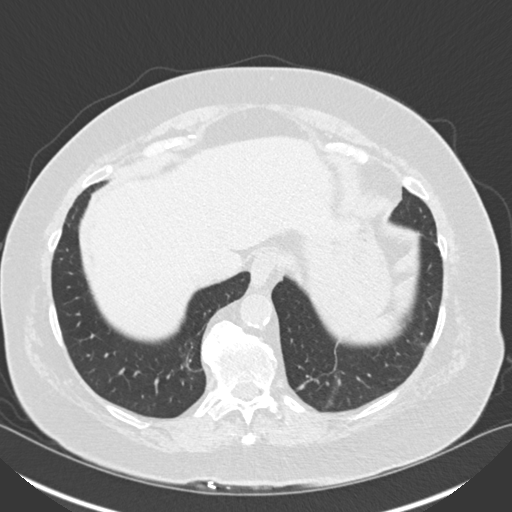
[im 61/161  mediastinal]
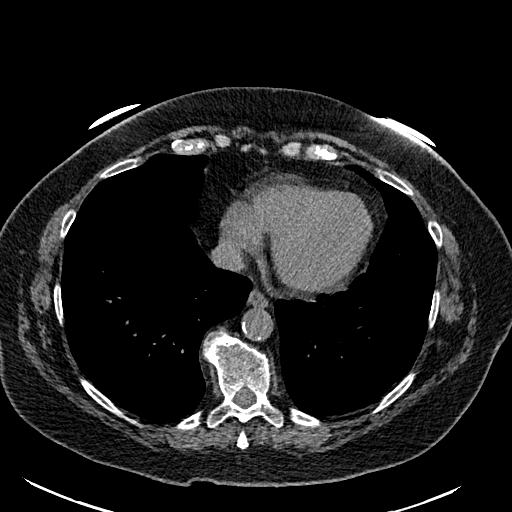
[im 61/161  lung]
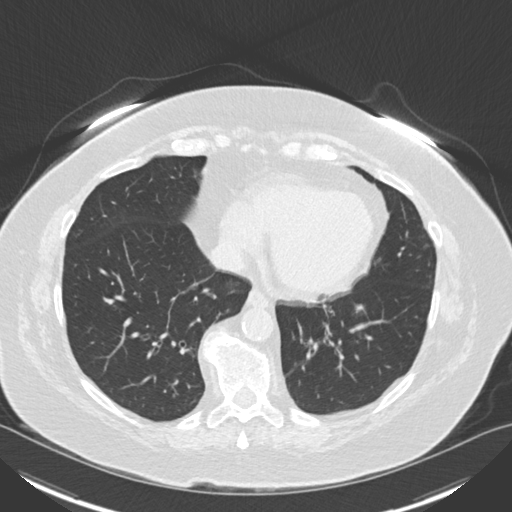
[im 77/161  lung]
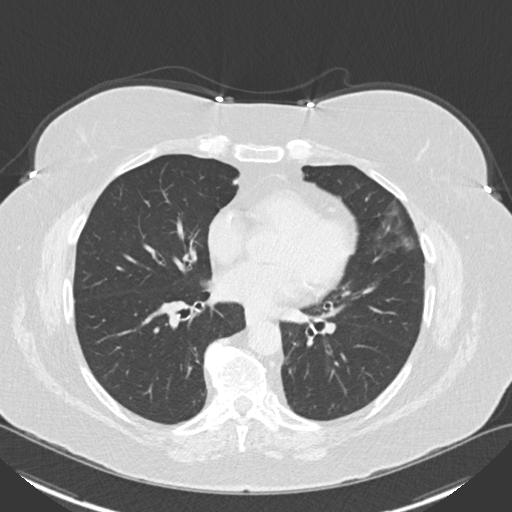
[im 84/161  lung]
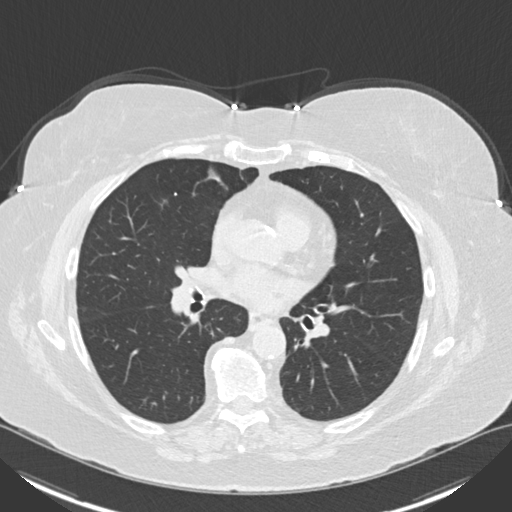
[im 100/161  lung]
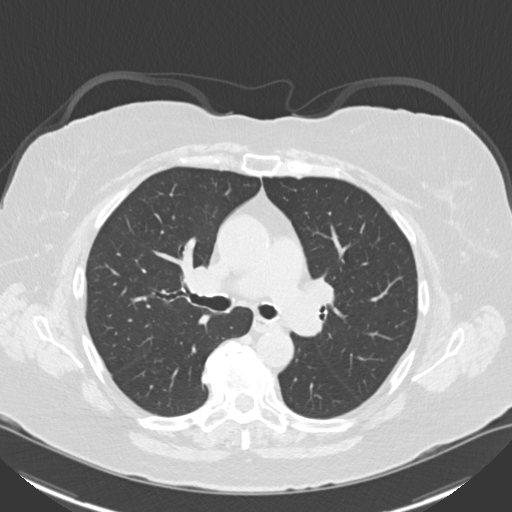
[im 115/161  mediastinal]
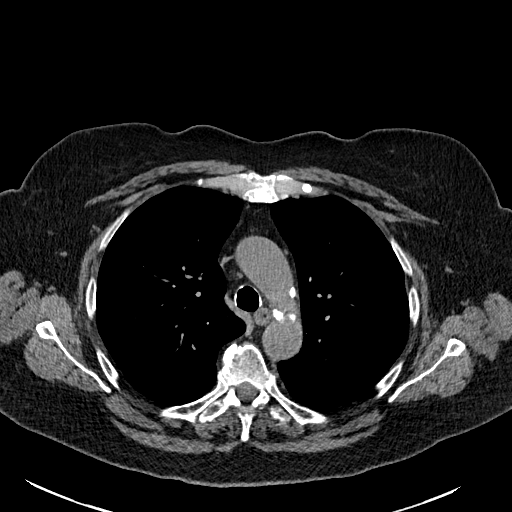
[im 115/161  lung]
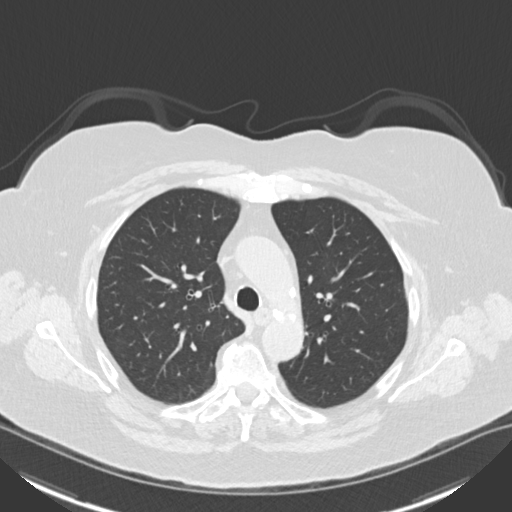
[im 122/161  lung]
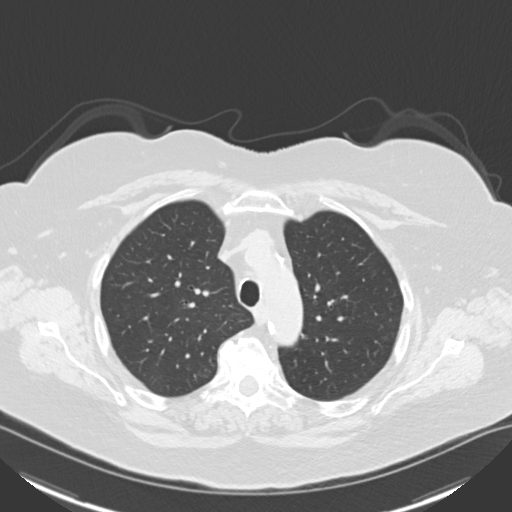
[im 138/161  lung]
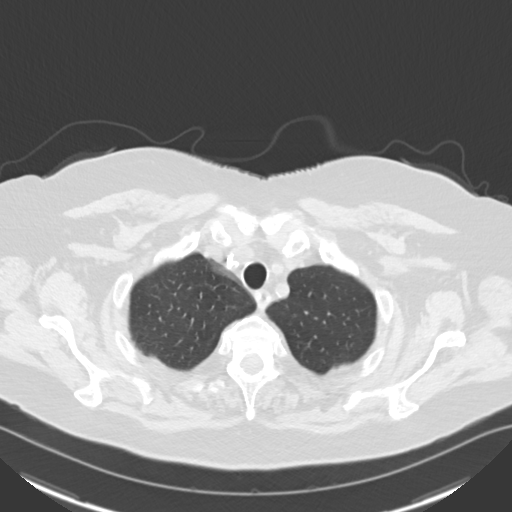
[im 153/161  lung]
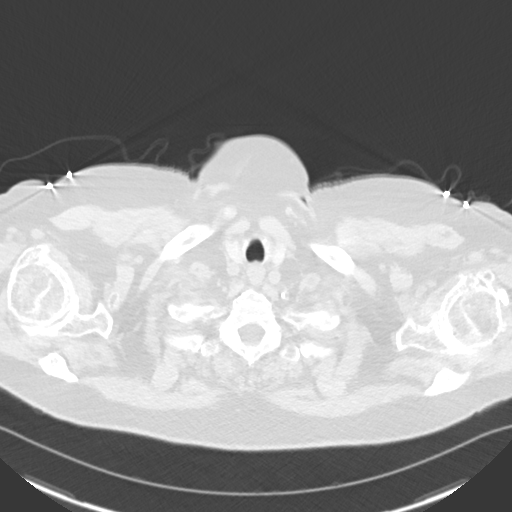

[Series 7: coronal · coronal · 0.65mm/px · 3 of 101 slices shown]
[im 21/101  lung]
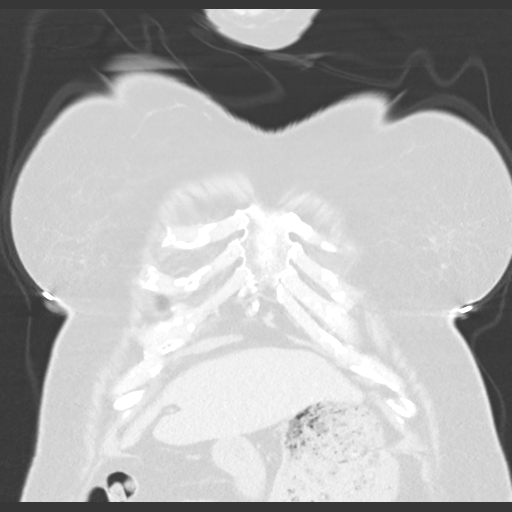
[im 41/101  lung]
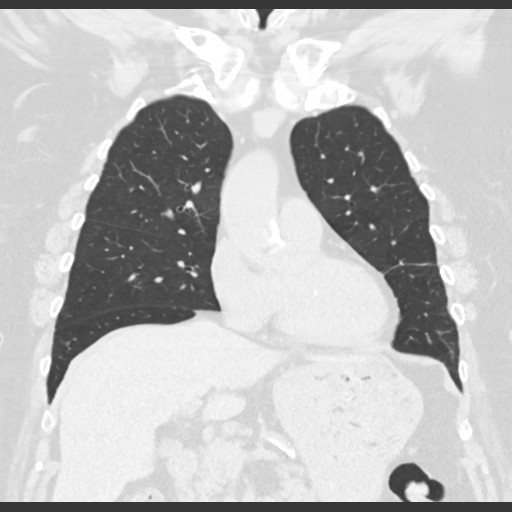
[im 61/101  lung]
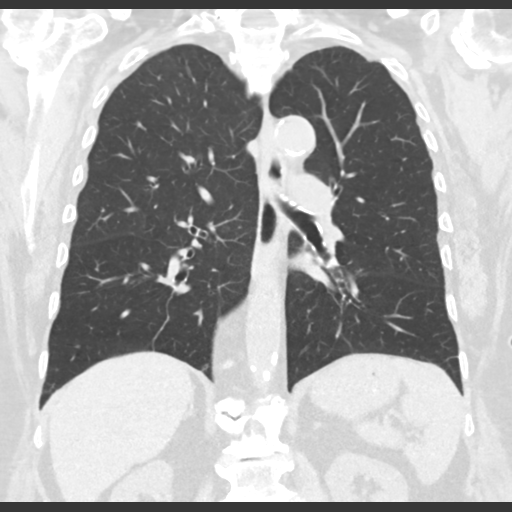

[15 of 36 positions shown; findings below may reference images not displayed]

FINDINGS: Cardiovascular: Atherosclerotic calcification of the arterial
vasculature, including coronary arteries. Heart size normal. No
pericardial effusion.

Mediastinum/Nodes: No pathologically enlarged mediastinal or
axillary lymph nodes. Esophagus is grossly unremarkable.

Lungs/Pleura: Negative for subpleural reticulation, traction
bronchiectasis/bronchiolectasis, ground-glass, architectural
distortion or honeycombing. Mild scattered scarring in the right
middle lobe, lingula and both lower lobes. 2 mm subpleural left
lower lobe nodule (image 91), likely benign. Lungs are otherwise
clear. No pleural fluid. Airway is unremarkable. Mild air trapping.

Upper Abdomen: Liver margin appears slightly irregular. Visualized
portions of the liver, gallbladder, adrenal glands, kidneys, spleen,
pancreas, stomach and bowel are otherwise grossly unremarkable.
Porta hepatis lymph nodes measure up to 12 mm.

Musculoskeletal: Degenerative changes in the spine. No worrisome
lytic or sclerotic lesions. Degenerative changes are seen in the
shoulders as well.
IMPRESSION: 1. No evidence of interstitial lung disease. Mild air trapping is
indicative of small airways disease.
2. Aortic atherosclerosis ([id number]-170.0). Coronary artery
calcification.
3. Marginal irregularity of the liver is indicative of cirrhosis.
Borderline enlarged porta hepatis lymph nodes are likely related.

## 2018-11-19 DIAGNOSIS — Z299 Encounter for prophylactic measures, unspecified: Secondary | ICD-10-CM | POA: Diagnosis not present

## 2018-11-19 DIAGNOSIS — Z6836 Body mass index (BMI) 36.0-36.9, adult: Secondary | ICD-10-CM | POA: Diagnosis not present

## 2018-11-19 DIAGNOSIS — E7800 Pure hypercholesterolemia, unspecified: Secondary | ICD-10-CM | POA: Diagnosis not present

## 2018-11-19 DIAGNOSIS — I10 Essential (primary) hypertension: Secondary | ICD-10-CM | POA: Diagnosis not present

## 2018-11-19 DIAGNOSIS — G4700 Insomnia, unspecified: Secondary | ICD-10-CM | POA: Diagnosis not present

## 2018-12-10 DIAGNOSIS — Z1231 Encounter for screening mammogram for malignant neoplasm of breast: Secondary | ICD-10-CM | POA: Diagnosis not present

## 2018-12-28 DIAGNOSIS — Z23 Encounter for immunization: Secondary | ICD-10-CM | POA: Diagnosis not present

## 2019-01-05 DIAGNOSIS — D1801 Hemangioma of skin and subcutaneous tissue: Secondary | ICD-10-CM | POA: Diagnosis not present

## 2019-01-05 DIAGNOSIS — L570 Actinic keratosis: Secondary | ICD-10-CM | POA: Diagnosis not present

## 2019-01-05 DIAGNOSIS — D485 Neoplasm of uncertain behavior of skin: Secondary | ICD-10-CM | POA: Diagnosis not present

## 2019-01-05 DIAGNOSIS — L820 Inflamed seborrheic keratosis: Secondary | ICD-10-CM | POA: Diagnosis not present

## 2019-01-05 DIAGNOSIS — L819 Disorder of pigmentation, unspecified: Secondary | ICD-10-CM | POA: Diagnosis not present

## 2019-01-05 DIAGNOSIS — L821 Other seborrheic keratosis: Secondary | ICD-10-CM | POA: Diagnosis not present

## 2019-01-11 DIAGNOSIS — Z0000 Encounter for general adult medical examination without abnormal findings: Secondary | ICD-10-CM | POA: Diagnosis not present

## 2019-01-11 DIAGNOSIS — Z299 Encounter for prophylactic measures, unspecified: Secondary | ICD-10-CM | POA: Diagnosis not present

## 2019-01-11 DIAGNOSIS — R7989 Other specified abnormal findings of blood chemistry: Secondary | ICD-10-CM | POA: Diagnosis not present

## 2019-01-11 DIAGNOSIS — I10 Essential (primary) hypertension: Secondary | ICD-10-CM | POA: Diagnosis not present

## 2019-01-11 DIAGNOSIS — R5383 Other fatigue: Secondary | ICD-10-CM | POA: Diagnosis not present

## 2019-01-11 DIAGNOSIS — Z1331 Encounter for screening for depression: Secondary | ICD-10-CM | POA: Diagnosis not present

## 2019-01-11 DIAGNOSIS — F411 Generalized anxiety disorder: Secondary | ICD-10-CM | POA: Diagnosis not present

## 2019-01-11 DIAGNOSIS — Z79899 Other long term (current) drug therapy: Secondary | ICD-10-CM | POA: Diagnosis not present

## 2019-01-11 DIAGNOSIS — Z7189 Other specified counseling: Secondary | ICD-10-CM | POA: Diagnosis not present

## 2019-01-11 DIAGNOSIS — Z1211 Encounter for screening for malignant neoplasm of colon: Secondary | ICD-10-CM | POA: Diagnosis not present

## 2019-01-11 DIAGNOSIS — F259 Schizoaffective disorder, unspecified: Secondary | ICD-10-CM | POA: Diagnosis not present

## 2019-01-11 DIAGNOSIS — Z1339 Encounter for screening examination for other mental health and behavioral disorders: Secondary | ICD-10-CM | POA: Diagnosis not present

## 2019-01-11 DIAGNOSIS — E7800 Pure hypercholesterolemia, unspecified: Secondary | ICD-10-CM | POA: Diagnosis not present

## 2019-01-11 DIAGNOSIS — Z6836 Body mass index (BMI) 36.0-36.9, adult: Secondary | ICD-10-CM | POA: Diagnosis not present

## 2019-01-11 DIAGNOSIS — F39 Unspecified mood [affective] disorder: Secondary | ICD-10-CM | POA: Diagnosis not present

## 2019-04-06 DIAGNOSIS — Z23 Encounter for immunization: Secondary | ICD-10-CM | POA: Diagnosis not present

## 2019-05-02 DIAGNOSIS — H524 Presbyopia: Secondary | ICD-10-CM | POA: Diagnosis not present

## 2019-05-02 DIAGNOSIS — H25093 Other age-related incipient cataract, bilateral: Secondary | ICD-10-CM | POA: Diagnosis not present

## 2019-05-02 DIAGNOSIS — H2513 Age-related nuclear cataract, bilateral: Secondary | ICD-10-CM | POA: Diagnosis not present

## 2019-05-02 DIAGNOSIS — H52223 Regular astigmatism, bilateral: Secondary | ICD-10-CM | POA: Diagnosis not present

## 2019-05-06 DIAGNOSIS — Z23 Encounter for immunization: Secondary | ICD-10-CM | POA: Diagnosis not present

## 2019-05-27 DIAGNOSIS — H2511 Age-related nuclear cataract, right eye: Secondary | ICD-10-CM | POA: Diagnosis not present

## 2019-05-27 DIAGNOSIS — H2513 Age-related nuclear cataract, bilateral: Secondary | ICD-10-CM | POA: Diagnosis not present

## 2019-06-29 DIAGNOSIS — H2511 Age-related nuclear cataract, right eye: Secondary | ICD-10-CM | POA: Diagnosis not present

## 2019-07-13 DIAGNOSIS — H2512 Age-related nuclear cataract, left eye: Secondary | ICD-10-CM | POA: Diagnosis not present

## 2019-12-12 DIAGNOSIS — Z1231 Encounter for screening mammogram for malignant neoplasm of breast: Secondary | ICD-10-CM | POA: Diagnosis not present

## 2019-12-26 DIAGNOSIS — Z23 Encounter for immunization: Secondary | ICD-10-CM | POA: Diagnosis not present

## 2020-01-04 DIAGNOSIS — L821 Other seborrheic keratosis: Secondary | ICD-10-CM | POA: Diagnosis not present

## 2020-01-04 DIAGNOSIS — L570 Actinic keratosis: Secondary | ICD-10-CM | POA: Diagnosis not present

## 2020-01-04 DIAGNOSIS — L814 Other melanin hyperpigmentation: Secondary | ICD-10-CM | POA: Diagnosis not present

## 2020-01-04 DIAGNOSIS — D239 Other benign neoplasm of skin, unspecified: Secondary | ICD-10-CM | POA: Diagnosis not present

## 2020-01-13 DIAGNOSIS — E1165 Type 2 diabetes mellitus with hyperglycemia: Secondary | ICD-10-CM | POA: Diagnosis not present

## 2020-01-13 DIAGNOSIS — G4700 Insomnia, unspecified: Secondary | ICD-10-CM | POA: Diagnosis not present

## 2020-01-13 DIAGNOSIS — I10 Essential (primary) hypertension: Secondary | ICD-10-CM | POA: Diagnosis not present

## 2020-01-13 DIAGNOSIS — R5383 Other fatigue: Secondary | ICD-10-CM | POA: Diagnosis not present

## 2020-01-13 DIAGNOSIS — Z1331 Encounter for screening for depression: Secondary | ICD-10-CM | POA: Diagnosis not present

## 2020-01-13 DIAGNOSIS — Z7189 Other specified counseling: Secondary | ICD-10-CM | POA: Diagnosis not present

## 2020-01-13 DIAGNOSIS — Z299 Encounter for prophylactic measures, unspecified: Secondary | ICD-10-CM | POA: Diagnosis not present

## 2020-01-13 DIAGNOSIS — E7800 Pure hypercholesterolemia, unspecified: Secondary | ICD-10-CM | POA: Diagnosis not present

## 2020-01-13 DIAGNOSIS — Z79899 Other long term (current) drug therapy: Secondary | ICD-10-CM | POA: Diagnosis not present

## 2020-01-13 DIAGNOSIS — Z0000 Encounter for general adult medical examination without abnormal findings: Secondary | ICD-10-CM | POA: Diagnosis not present

## 2020-01-13 DIAGNOSIS — J45909 Unspecified asthma, uncomplicated: Secondary | ICD-10-CM | POA: Diagnosis not present

## 2020-01-13 DIAGNOSIS — Z1339 Encounter for screening examination for other mental health and behavioral disorders: Secondary | ICD-10-CM | POA: Diagnosis not present

## 2020-01-17 DIAGNOSIS — Z23 Encounter for immunization: Secondary | ICD-10-CM | POA: Diagnosis not present

## 2020-09-05 DIAGNOSIS — H5203 Hypermetropia, bilateral: Secondary | ICD-10-CM | POA: Diagnosis not present

## 2020-09-05 DIAGNOSIS — H524 Presbyopia: Secondary | ICD-10-CM | POA: Diagnosis not present

## 2020-09-05 DIAGNOSIS — H52222 Regular astigmatism, left eye: Secondary | ICD-10-CM | POA: Diagnosis not present

## 2020-09-05 DIAGNOSIS — Z961 Presence of intraocular lens: Secondary | ICD-10-CM | POA: Diagnosis not present

## 2020-09-05 DIAGNOSIS — H26493 Other secondary cataract, bilateral: Secondary | ICD-10-CM | POA: Diagnosis not present

## 2020-12-14 DIAGNOSIS — Z1231 Encounter for screening mammogram for malignant neoplasm of breast: Secondary | ICD-10-CM | POA: Diagnosis not present

## 2021-01-01 DIAGNOSIS — L570 Actinic keratosis: Secondary | ICD-10-CM | POA: Diagnosis not present

## 2021-01-14 DIAGNOSIS — Z0000 Encounter for general adult medical examination without abnormal findings: Secondary | ICD-10-CM | POA: Diagnosis not present

## 2021-01-14 DIAGNOSIS — Z79899 Other long term (current) drug therapy: Secondary | ICD-10-CM | POA: Diagnosis not present

## 2021-01-14 DIAGNOSIS — Z7189 Other specified counseling: Secondary | ICD-10-CM | POA: Diagnosis not present

## 2021-01-14 DIAGNOSIS — Z1331 Encounter for screening for depression: Secondary | ICD-10-CM | POA: Diagnosis not present

## 2021-01-14 DIAGNOSIS — Z789 Other specified health status: Secondary | ICD-10-CM | POA: Diagnosis not present

## 2021-01-14 DIAGNOSIS — E7800 Pure hypercholesterolemia, unspecified: Secondary | ICD-10-CM | POA: Diagnosis not present

## 2021-01-14 DIAGNOSIS — Z1339 Encounter for screening examination for other mental health and behavioral disorders: Secondary | ICD-10-CM | POA: Diagnosis not present

## 2021-01-14 DIAGNOSIS — Z299 Encounter for prophylactic measures, unspecified: Secondary | ICD-10-CM | POA: Diagnosis not present

## 2021-01-14 DIAGNOSIS — Z23 Encounter for immunization: Secondary | ICD-10-CM | POA: Diagnosis not present

## 2021-01-14 DIAGNOSIS — R5383 Other fatigue: Secondary | ICD-10-CM | POA: Diagnosis not present

## 2021-01-14 DIAGNOSIS — F259 Schizoaffective disorder, unspecified: Secondary | ICD-10-CM | POA: Diagnosis not present

## 2021-01-14 DIAGNOSIS — E1165 Type 2 diabetes mellitus with hyperglycemia: Secondary | ICD-10-CM | POA: Diagnosis not present

## 2021-01-14 DIAGNOSIS — I10 Essential (primary) hypertension: Secondary | ICD-10-CM | POA: Diagnosis not present

## 2021-01-14 DIAGNOSIS — Z6836 Body mass index (BMI) 36.0-36.9, adult: Secondary | ICD-10-CM | POA: Diagnosis not present

## 2021-10-22 DIAGNOSIS — H52223 Regular astigmatism, bilateral: Secondary | ICD-10-CM | POA: Diagnosis not present

## 2021-10-22 DIAGNOSIS — H5203 Hypermetropia, bilateral: Secondary | ICD-10-CM | POA: Diagnosis not present

## 2021-10-22 DIAGNOSIS — H26493 Other secondary cataract, bilateral: Secondary | ICD-10-CM | POA: Diagnosis not present

## 2021-10-22 DIAGNOSIS — Z961 Presence of intraocular lens: Secondary | ICD-10-CM | POA: Diagnosis not present

## 2021-10-22 DIAGNOSIS — H524 Presbyopia: Secondary | ICD-10-CM | POA: Diagnosis not present

## 2021-11-26 DIAGNOSIS — H0015 Chalazion left lower eyelid: Secondary | ICD-10-CM | POA: Diagnosis not present

## 2021-11-26 DIAGNOSIS — H04123 Dry eye syndrome of bilateral lacrimal glands: Secondary | ICD-10-CM | POA: Diagnosis not present

## 2021-11-26 DIAGNOSIS — H1045 Other chronic allergic conjunctivitis: Secondary | ICD-10-CM | POA: Diagnosis not present

## 2021-11-26 DIAGNOSIS — H02889 Meibomian gland dysfunction of unspecified eye, unspecified eyelid: Secondary | ICD-10-CM | POA: Diagnosis not present

## 2022-01-01 DIAGNOSIS — Z23 Encounter for immunization: Secondary | ICD-10-CM | POA: Diagnosis not present

## 2022-01-06 DIAGNOSIS — L570 Actinic keratosis: Secondary | ICD-10-CM | POA: Diagnosis not present

## 2022-01-15 DIAGNOSIS — F411 Generalized anxiety disorder: Secondary | ICD-10-CM | POA: Diagnosis not present

## 2022-01-15 DIAGNOSIS — Z1331 Encounter for screening for depression: Secondary | ICD-10-CM | POA: Diagnosis not present

## 2022-01-15 DIAGNOSIS — Z79899 Other long term (current) drug therapy: Secondary | ICD-10-CM | POA: Diagnosis not present

## 2022-01-15 DIAGNOSIS — Z0000 Encounter for general adult medical examination without abnormal findings: Secondary | ICD-10-CM | POA: Diagnosis not present

## 2022-01-15 DIAGNOSIS — R5383 Other fatigue: Secondary | ICD-10-CM | POA: Diagnosis not present

## 2022-01-15 DIAGNOSIS — I10 Essential (primary) hypertension: Secondary | ICD-10-CM | POA: Diagnosis not present

## 2022-01-15 DIAGNOSIS — E7800 Pure hypercholesterolemia, unspecified: Secondary | ICD-10-CM | POA: Diagnosis not present

## 2022-01-15 DIAGNOSIS — Z7189 Other specified counseling: Secondary | ICD-10-CM | POA: Diagnosis not present

## 2022-01-15 DIAGNOSIS — Z299 Encounter for prophylactic measures, unspecified: Secondary | ICD-10-CM | POA: Diagnosis not present

## 2022-01-15 DIAGNOSIS — Z6836 Body mass index (BMI) 36.0-36.9, adult: Secondary | ICD-10-CM | POA: Diagnosis not present

## 2022-01-15 DIAGNOSIS — E1165 Type 2 diabetes mellitus with hyperglycemia: Secondary | ICD-10-CM | POA: Diagnosis not present

## 2022-01-22 DIAGNOSIS — Z1231 Encounter for screening mammogram for malignant neoplasm of breast: Secondary | ICD-10-CM | POA: Diagnosis not present

## 2022-01-30 DIAGNOSIS — Z1231 Encounter for screening mammogram for malignant neoplasm of breast: Secondary | ICD-10-CM | POA: Diagnosis not present

## 2022-04-17 DIAGNOSIS — E1165 Type 2 diabetes mellitus with hyperglycemia: Secondary | ICD-10-CM | POA: Diagnosis not present

## 2022-04-17 DIAGNOSIS — N183 Chronic kidney disease, stage 3 unspecified: Secondary | ICD-10-CM | POA: Diagnosis not present

## 2022-04-17 DIAGNOSIS — I10 Essential (primary) hypertension: Secondary | ICD-10-CM | POA: Diagnosis not present

## 2022-04-17 DIAGNOSIS — Z299 Encounter for prophylactic measures, unspecified: Secondary | ICD-10-CM | POA: Diagnosis not present

## 2022-04-17 DIAGNOSIS — I700 Atherosclerosis of aorta: Secondary | ICD-10-CM | POA: Diagnosis not present

## 2022-11-03 DIAGNOSIS — N183 Chronic kidney disease, stage 3 unspecified: Secondary | ICD-10-CM | POA: Diagnosis not present

## 2022-11-03 DIAGNOSIS — I10 Essential (primary) hypertension: Secondary | ICD-10-CM | POA: Diagnosis not present

## 2022-11-03 DIAGNOSIS — I700 Atherosclerosis of aorta: Secondary | ICD-10-CM | POA: Diagnosis not present

## 2022-11-03 DIAGNOSIS — F319 Bipolar disorder, unspecified: Secondary | ICD-10-CM | POA: Diagnosis not present

## 2022-11-03 DIAGNOSIS — Z299 Encounter for prophylactic measures, unspecified: Secondary | ICD-10-CM | POA: Diagnosis not present

## 2023-01-14 DIAGNOSIS — I10 Essential (primary) hypertension: Secondary | ICD-10-CM | POA: Diagnosis not present

## 2023-01-14 DIAGNOSIS — M549 Dorsalgia, unspecified: Secondary | ICD-10-CM | POA: Diagnosis not present

## 2023-01-14 DIAGNOSIS — Z299 Encounter for prophylactic measures, unspecified: Secondary | ICD-10-CM | POA: Diagnosis not present

## 2023-01-14 DIAGNOSIS — R5383 Other fatigue: Secondary | ICD-10-CM | POA: Diagnosis not present

## 2023-01-14 DIAGNOSIS — R252 Cramp and spasm: Secondary | ICD-10-CM | POA: Diagnosis not present

## 2023-01-14 DIAGNOSIS — M6281 Muscle weakness (generalized): Secondary | ICD-10-CM | POA: Diagnosis not present

## 2023-01-14 DIAGNOSIS — E1165 Type 2 diabetes mellitus with hyperglycemia: Secondary | ICD-10-CM | POA: Diagnosis not present

## 2023-02-04 DIAGNOSIS — M1711 Unilateral primary osteoarthritis, right knee: Secondary | ICD-10-CM | POA: Diagnosis not present

## 2023-02-04 DIAGNOSIS — G8929 Other chronic pain: Secondary | ICD-10-CM | POA: Diagnosis not present

## 2023-02-04 DIAGNOSIS — M25561 Pain in right knee: Secondary | ICD-10-CM | POA: Diagnosis not present

## 2023-02-19 DIAGNOSIS — R2689 Other abnormalities of gait and mobility: Secondary | ICD-10-CM | POA: Diagnosis not present

## 2023-02-19 DIAGNOSIS — R2681 Unsteadiness on feet: Secondary | ICD-10-CM | POA: Diagnosis not present

## 2023-02-19 DIAGNOSIS — M25561 Pain in right knee: Secondary | ICD-10-CM | POA: Diagnosis not present

## 2023-02-19 DIAGNOSIS — M6281 Muscle weakness (generalized): Secondary | ICD-10-CM | POA: Diagnosis not present

## 2023-02-24 DIAGNOSIS — R2689 Other abnormalities of gait and mobility: Secondary | ICD-10-CM | POA: Diagnosis not present

## 2023-02-24 DIAGNOSIS — R2681 Unsteadiness on feet: Secondary | ICD-10-CM | POA: Diagnosis not present

## 2023-02-24 DIAGNOSIS — M25561 Pain in right knee: Secondary | ICD-10-CM | POA: Diagnosis not present

## 2023-02-24 DIAGNOSIS — M6281 Muscle weakness (generalized): Secondary | ICD-10-CM | POA: Diagnosis not present

## 2023-02-26 DIAGNOSIS — M25561 Pain in right knee: Secondary | ICD-10-CM | POA: Diagnosis not present

## 2023-02-26 DIAGNOSIS — R2681 Unsteadiness on feet: Secondary | ICD-10-CM | POA: Diagnosis not present

## 2023-02-26 DIAGNOSIS — M6281 Muscle weakness (generalized): Secondary | ICD-10-CM | POA: Diagnosis not present

## 2023-02-26 DIAGNOSIS — R2689 Other abnormalities of gait and mobility: Secondary | ICD-10-CM | POA: Diagnosis not present

## 2023-03-03 DIAGNOSIS — M25561 Pain in right knee: Secondary | ICD-10-CM | POA: Diagnosis not present

## 2023-03-03 DIAGNOSIS — R2689 Other abnormalities of gait and mobility: Secondary | ICD-10-CM | POA: Diagnosis not present

## 2023-03-03 DIAGNOSIS — R2681 Unsteadiness on feet: Secondary | ICD-10-CM | POA: Diagnosis not present

## 2023-03-03 DIAGNOSIS — M6281 Muscle weakness (generalized): Secondary | ICD-10-CM | POA: Diagnosis not present

## 2023-03-05 DIAGNOSIS — M25561 Pain in right knee: Secondary | ICD-10-CM | POA: Diagnosis not present

## 2023-03-05 DIAGNOSIS — R2689 Other abnormalities of gait and mobility: Secondary | ICD-10-CM | POA: Diagnosis not present

## 2023-03-05 DIAGNOSIS — M6281 Muscle weakness (generalized): Secondary | ICD-10-CM | POA: Diagnosis not present

## 2023-03-05 DIAGNOSIS — R2681 Unsteadiness on feet: Secondary | ICD-10-CM | POA: Diagnosis not present

## 2023-03-06 DIAGNOSIS — Z23 Encounter for immunization: Secondary | ICD-10-CM | POA: Diagnosis not present

## 2023-03-06 DIAGNOSIS — Z1339 Encounter for screening examination for other mental health and behavioral disorders: Secondary | ICD-10-CM | POA: Diagnosis not present

## 2023-03-06 DIAGNOSIS — Z0000 Encounter for general adult medical examination without abnormal findings: Secondary | ICD-10-CM | POA: Diagnosis not present

## 2023-03-06 DIAGNOSIS — F319 Bipolar disorder, unspecified: Secondary | ICD-10-CM | POA: Diagnosis not present

## 2023-03-06 DIAGNOSIS — Z7189 Other specified counseling: Secondary | ICD-10-CM | POA: Diagnosis not present

## 2023-03-06 DIAGNOSIS — Z6834 Body mass index (BMI) 34.0-34.9, adult: Secondary | ICD-10-CM | POA: Diagnosis not present

## 2023-03-06 DIAGNOSIS — I10 Essential (primary) hypertension: Secondary | ICD-10-CM | POA: Diagnosis not present

## 2023-03-06 DIAGNOSIS — Z1331 Encounter for screening for depression: Secondary | ICD-10-CM | POA: Diagnosis not present

## 2023-03-06 DIAGNOSIS — Z299 Encounter for prophylactic measures, unspecified: Secondary | ICD-10-CM | POA: Diagnosis not present

## 2023-03-06 DIAGNOSIS — J45909 Unspecified asthma, uncomplicated: Secondary | ICD-10-CM | POA: Diagnosis not present

## 2023-03-09 DIAGNOSIS — E559 Vitamin D deficiency, unspecified: Secondary | ICD-10-CM | POA: Diagnosis not present

## 2023-03-09 DIAGNOSIS — Z79899 Other long term (current) drug therapy: Secondary | ICD-10-CM | POA: Diagnosis not present

## 2023-03-09 DIAGNOSIS — R5383 Other fatigue: Secondary | ICD-10-CM | POA: Diagnosis not present

## 2023-03-09 DIAGNOSIS — E7800 Pure hypercholesterolemia, unspecified: Secondary | ICD-10-CM | POA: Diagnosis not present

## 2023-03-12 DIAGNOSIS — R2689 Other abnormalities of gait and mobility: Secondary | ICD-10-CM | POA: Diagnosis not present

## 2023-03-12 DIAGNOSIS — M6281 Muscle weakness (generalized): Secondary | ICD-10-CM | POA: Diagnosis not present

## 2023-03-12 DIAGNOSIS — R2681 Unsteadiness on feet: Secondary | ICD-10-CM | POA: Diagnosis not present

## 2023-03-12 DIAGNOSIS — M25561 Pain in right knee: Secondary | ICD-10-CM | POA: Diagnosis not present

## 2023-03-17 DIAGNOSIS — R2689 Other abnormalities of gait and mobility: Secondary | ICD-10-CM | POA: Diagnosis not present

## 2023-03-17 DIAGNOSIS — R2681 Unsteadiness on feet: Secondary | ICD-10-CM | POA: Diagnosis not present

## 2023-03-17 DIAGNOSIS — M25561 Pain in right knee: Secondary | ICD-10-CM | POA: Diagnosis not present

## 2023-03-17 DIAGNOSIS — M6281 Muscle weakness (generalized): Secondary | ICD-10-CM | POA: Diagnosis not present

## 2023-03-19 DIAGNOSIS — M25561 Pain in right knee: Secondary | ICD-10-CM | POA: Diagnosis not present

## 2023-03-19 DIAGNOSIS — R2681 Unsteadiness on feet: Secondary | ICD-10-CM | POA: Diagnosis not present

## 2023-03-19 DIAGNOSIS — R2689 Other abnormalities of gait and mobility: Secondary | ICD-10-CM | POA: Diagnosis not present

## 2023-03-19 DIAGNOSIS — M6281 Muscle weakness (generalized): Secondary | ICD-10-CM | POA: Diagnosis not present

## 2023-03-26 DIAGNOSIS — M6281 Muscle weakness (generalized): Secondary | ICD-10-CM | POA: Diagnosis not present

## 2023-03-26 DIAGNOSIS — M25561 Pain in right knee: Secondary | ICD-10-CM | POA: Diagnosis not present

## 2023-03-26 DIAGNOSIS — R2689 Other abnormalities of gait and mobility: Secondary | ICD-10-CM | POA: Diagnosis not present

## 2023-03-26 DIAGNOSIS — R2681 Unsteadiness on feet: Secondary | ICD-10-CM | POA: Diagnosis not present

## 2023-03-27 DIAGNOSIS — R2689 Other abnormalities of gait and mobility: Secondary | ICD-10-CM | POA: Diagnosis not present

## 2023-03-27 DIAGNOSIS — M25561 Pain in right knee: Secondary | ICD-10-CM | POA: Diagnosis not present

## 2023-03-27 DIAGNOSIS — M6281 Muscle weakness (generalized): Secondary | ICD-10-CM | POA: Diagnosis not present

## 2023-03-27 DIAGNOSIS — R2681 Unsteadiness on feet: Secondary | ICD-10-CM | POA: Diagnosis not present

## 2023-03-31 DIAGNOSIS — R2681 Unsteadiness on feet: Secondary | ICD-10-CM | POA: Diagnosis not present

## 2023-03-31 DIAGNOSIS — R2689 Other abnormalities of gait and mobility: Secondary | ICD-10-CM | POA: Diagnosis not present

## 2023-03-31 DIAGNOSIS — M25561 Pain in right knee: Secondary | ICD-10-CM | POA: Diagnosis not present

## 2023-03-31 DIAGNOSIS — M6281 Muscle weakness (generalized): Secondary | ICD-10-CM | POA: Diagnosis not present

## 2023-04-02 DIAGNOSIS — R2689 Other abnormalities of gait and mobility: Secondary | ICD-10-CM | POA: Diagnosis not present

## 2023-04-02 DIAGNOSIS — M6281 Muscle weakness (generalized): Secondary | ICD-10-CM | POA: Diagnosis not present

## 2023-04-02 DIAGNOSIS — R2681 Unsteadiness on feet: Secondary | ICD-10-CM | POA: Diagnosis not present

## 2023-04-02 DIAGNOSIS — M25561 Pain in right knee: Secondary | ICD-10-CM | POA: Diagnosis not present

## 2023-04-07 DIAGNOSIS — R2689 Other abnormalities of gait and mobility: Secondary | ICD-10-CM | POA: Diagnosis not present

## 2023-04-07 DIAGNOSIS — R2681 Unsteadiness on feet: Secondary | ICD-10-CM | POA: Diagnosis not present

## 2023-04-07 DIAGNOSIS — M6281 Muscle weakness (generalized): Secondary | ICD-10-CM | POA: Diagnosis not present

## 2023-04-07 DIAGNOSIS — M25561 Pain in right knee: Secondary | ICD-10-CM | POA: Diagnosis not present

## 2023-04-09 DIAGNOSIS — R2689 Other abnormalities of gait and mobility: Secondary | ICD-10-CM | POA: Diagnosis not present

## 2023-04-09 DIAGNOSIS — R2681 Unsteadiness on feet: Secondary | ICD-10-CM | POA: Diagnosis not present

## 2023-04-09 DIAGNOSIS — M25561 Pain in right knee: Secondary | ICD-10-CM | POA: Diagnosis not present

## 2023-04-09 DIAGNOSIS — M6281 Muscle weakness (generalized): Secondary | ICD-10-CM | POA: Diagnosis not present

## 2023-06-01 DIAGNOSIS — Z1331 Encounter for screening for depression: Secondary | ICD-10-CM | POA: Diagnosis not present

## 2023-06-01 DIAGNOSIS — Z299 Encounter for prophylactic measures, unspecified: Secondary | ICD-10-CM | POA: Diagnosis not present

## 2023-06-01 DIAGNOSIS — I700 Atherosclerosis of aorta: Secondary | ICD-10-CM | POA: Diagnosis not present

## 2023-06-01 DIAGNOSIS — E1165 Type 2 diabetes mellitus with hyperglycemia: Secondary | ICD-10-CM | POA: Diagnosis not present

## 2023-06-01 DIAGNOSIS — Z1339 Encounter for screening examination for other mental health and behavioral disorders: Secondary | ICD-10-CM | POA: Diagnosis not present

## 2023-06-01 DIAGNOSIS — I10 Essential (primary) hypertension: Secondary | ICD-10-CM | POA: Diagnosis not present

## 2023-06-01 DIAGNOSIS — Z7189 Other specified counseling: Secondary | ICD-10-CM | POA: Diagnosis not present

## 2023-06-01 DIAGNOSIS — F319 Bipolar disorder, unspecified: Secondary | ICD-10-CM | POA: Diagnosis not present

## 2023-06-01 DIAGNOSIS — Z0000 Encounter for general adult medical examination without abnormal findings: Secondary | ICD-10-CM | POA: Diagnosis not present

## 2023-06-01 DIAGNOSIS — Z6834 Body mass index (BMI) 34.0-34.9, adult: Secondary | ICD-10-CM | POA: Diagnosis not present

## 2023-06-02 DIAGNOSIS — Z79899 Other long term (current) drug therapy: Secondary | ICD-10-CM | POA: Diagnosis not present

## 2023-06-02 DIAGNOSIS — E559 Vitamin D deficiency, unspecified: Secondary | ICD-10-CM | POA: Diagnosis not present

## 2023-06-02 DIAGNOSIS — E7800 Pure hypercholesterolemia, unspecified: Secondary | ICD-10-CM | POA: Diagnosis not present

## 2023-06-02 DIAGNOSIS — R5383 Other fatigue: Secondary | ICD-10-CM | POA: Diagnosis not present

## 2023-06-06 DIAGNOSIS — M79651 Pain in right thigh: Secondary | ICD-10-CM | POA: Diagnosis not present

## 2023-06-06 DIAGNOSIS — R208 Other disturbances of skin sensation: Secondary | ICD-10-CM | POA: Diagnosis not present

## 2023-06-06 DIAGNOSIS — R791 Abnormal coagulation profile: Secondary | ICD-10-CM | POA: Diagnosis not present

## 2023-06-06 DIAGNOSIS — I781 Nevus, non-neoplastic: Secondary | ICD-10-CM | POA: Diagnosis not present

## 2023-06-06 DIAGNOSIS — M79604 Pain in right leg: Secondary | ICD-10-CM | POA: Diagnosis not present

## 2023-06-06 DIAGNOSIS — I8391 Asymptomatic varicose veins of right lower extremity: Secondary | ICD-10-CM | POA: Diagnosis not present

## 2023-06-06 DIAGNOSIS — R202 Paresthesia of skin: Secondary | ICD-10-CM | POA: Diagnosis not present

## 2023-06-06 DIAGNOSIS — Z880 Allergy status to penicillin: Secondary | ICD-10-CM | POA: Diagnosis not present

## 2023-06-06 DIAGNOSIS — Z91040 Latex allergy status: Secondary | ICD-10-CM | POA: Diagnosis not present

## 2023-06-06 DIAGNOSIS — Z885 Allergy status to narcotic agent status: Secondary | ICD-10-CM | POA: Diagnosis not present

## 2023-06-09 DIAGNOSIS — M79651 Pain in right thigh: Secondary | ICD-10-CM | POA: Diagnosis not present

## 2023-10-02 DIAGNOSIS — E7800 Pure hypercholesterolemia, unspecified: Secondary | ICD-10-CM | POA: Diagnosis not present

## 2023-10-02 DIAGNOSIS — G4700 Insomnia, unspecified: Secondary | ICD-10-CM | POA: Diagnosis not present

## 2023-10-02 DIAGNOSIS — F319 Bipolar disorder, unspecified: Secondary | ICD-10-CM | POA: Diagnosis not present

## 2023-12-09 DIAGNOSIS — E2839 Other primary ovarian failure: Secondary | ICD-10-CM | POA: Diagnosis not present

## 2023-12-09 DIAGNOSIS — E119 Type 2 diabetes mellitus without complications: Secondary | ICD-10-CM | POA: Diagnosis not present

## 2023-12-09 DIAGNOSIS — M179 Osteoarthritis of knee, unspecified: Secondary | ICD-10-CM | POA: Diagnosis not present

## 2023-12-09 DIAGNOSIS — Z299 Encounter for prophylactic measures, unspecified: Secondary | ICD-10-CM | POA: Diagnosis not present

## 2023-12-09 DIAGNOSIS — I10 Essential (primary) hypertension: Secondary | ICD-10-CM | POA: Diagnosis not present

## 2024-01-07 DIAGNOSIS — Z23 Encounter for immunization: Secondary | ICD-10-CM | POA: Diagnosis not present
